# Patient Record
Sex: Female | Born: 1985 | Race: White | Hispanic: No | Marital: Single | Smoking: Current every day smoker
Health system: Southern US, Community
[De-identification: ages and names within clinical notes are randomized; demographics above are authoritative.]

## PROBLEM LIST (undated history)

## (undated) DIAGNOSIS — F429 Obsessive-compulsive disorder, unspecified: Secondary | ICD-10-CM

## (undated) DIAGNOSIS — F32A Depression, unspecified: Secondary | ICD-10-CM

## (undated) DIAGNOSIS — F329 Major depressive disorder, single episode, unspecified: Secondary | ICD-10-CM

## (undated) DIAGNOSIS — F319 Bipolar disorder, unspecified: Secondary | ICD-10-CM

## (undated) HISTORY — PX: OTHER SURGICAL HISTORY: SHX169

## (undated) HISTORY — PX: TUBAL LIGATION: SHX77

---

## 2005-07-18 ENCOUNTER — Ambulatory Visit: Payer: Self-pay | Admitting: Internal Medicine

## 2005-07-18 ENCOUNTER — Inpatient Hospital Stay (HOSPITAL_COMMUNITY): Admission: EM | Admit: 2005-07-18 | Discharge: 2005-07-20 | Payer: Self-pay | Admitting: *Deleted

## 2005-07-22 ENCOUNTER — Emergency Department (HOSPITAL_COMMUNITY): Admission: EM | Admit: 2005-07-22 | Discharge: 2005-07-22 | Payer: Self-pay | Admitting: Emergency Medicine

## 2006-04-19 ENCOUNTER — Ambulatory Visit (HOSPITAL_COMMUNITY): Admission: RE | Admit: 2006-04-19 | Discharge: 2006-04-19 | Payer: Self-pay | Admitting: Obstetrics and Gynecology

## 2006-04-19 ENCOUNTER — Other Ambulatory Visit: Admission: RE | Admit: 2006-04-19 | Discharge: 2006-04-19 | Payer: Self-pay | Admitting: Obstetrics and Gynecology

## 2006-04-28 ENCOUNTER — Ambulatory Visit (HOSPITAL_COMMUNITY): Admission: RE | Admit: 2006-04-28 | Discharge: 2006-04-28 | Payer: Self-pay | Admitting: Obstetrics and Gynecology

## 2006-04-28 ENCOUNTER — Encounter (INDEPENDENT_AMBULATORY_CARE_PROVIDER_SITE_OTHER): Payer: Self-pay | Admitting: Specialist

## 2006-06-23 ENCOUNTER — Emergency Department (HOSPITAL_COMMUNITY): Admission: EM | Admit: 2006-06-23 | Discharge: 2006-06-23 | Payer: Self-pay | Admitting: Family Medicine

## 2006-08-12 ENCOUNTER — Emergency Department (HOSPITAL_COMMUNITY): Admission: EM | Admit: 2006-08-12 | Discharge: 2006-08-12 | Payer: Self-pay | Admitting: Emergency Medicine

## 2007-03-15 ENCOUNTER — Inpatient Hospital Stay (HOSPITAL_COMMUNITY): Admission: AD | Admit: 2007-03-15 | Discharge: 2007-03-15 | Payer: Self-pay | Admitting: Obstetrics & Gynecology

## 2007-09-25 ENCOUNTER — Emergency Department (HOSPITAL_COMMUNITY): Admission: EM | Admit: 2007-09-25 | Discharge: 2007-09-25 | Payer: Self-pay | Admitting: Emergency Medicine

## 2007-11-23 ENCOUNTER — Ambulatory Visit (HOSPITAL_COMMUNITY): Admission: RE | Admit: 2007-11-23 | Discharge: 2007-11-23 | Payer: Self-pay | Admitting: Family Medicine

## 2007-12-26 ENCOUNTER — Encounter: Admission: RE | Admit: 2007-12-26 | Discharge: 2007-12-26 | Payer: Self-pay | Admitting: Family Medicine

## 2008-02-28 ENCOUNTER — Emergency Department (HOSPITAL_COMMUNITY): Admission: EM | Admit: 2008-02-28 | Discharge: 2008-02-28 | Payer: Self-pay | Admitting: Emergency Medicine

## 2008-05-28 ENCOUNTER — Emergency Department (HOSPITAL_COMMUNITY): Admission: EM | Admit: 2008-05-28 | Discharge: 2008-05-28 | Payer: Self-pay | Admitting: Family Medicine

## 2008-05-31 ENCOUNTER — Emergency Department (HOSPITAL_COMMUNITY): Admission: EM | Admit: 2008-05-31 | Discharge: 2008-05-31 | Payer: Self-pay | Admitting: Emergency Medicine

## 2009-01-15 ENCOUNTER — Emergency Department (HOSPITAL_COMMUNITY): Admission: EM | Admit: 2009-01-15 | Discharge: 2009-01-15 | Payer: Self-pay | Admitting: Emergency Medicine

## 2009-02-03 ENCOUNTER — Inpatient Hospital Stay (HOSPITAL_COMMUNITY): Admission: AD | Admit: 2009-02-03 | Discharge: 2009-02-03 | Payer: Self-pay | Admitting: Obstetrics & Gynecology

## 2009-04-25 IMAGING — US US SOFT TISSUE HEAD/NECK
1 series · 14 of 25 positions shown · non-contrast
Comparison: Nuclear medicine thyroid scan of 11/23/2007

CLINICAL DATA: Enlarged thyroid on physical exam

THYROID ULTRASOUND
TECHNIQUE: Ultrasound examination of the thyroid gland and
adjacent soft tissues was performed.

[Series 1: us soft tissue head/neck · 0.06mm/px · 14 of 35 slices shown]
[im 1/35]
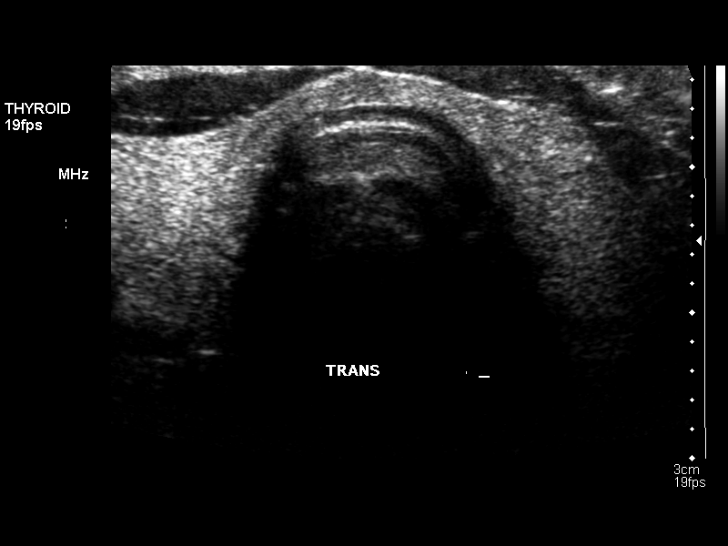
[im 3/35]
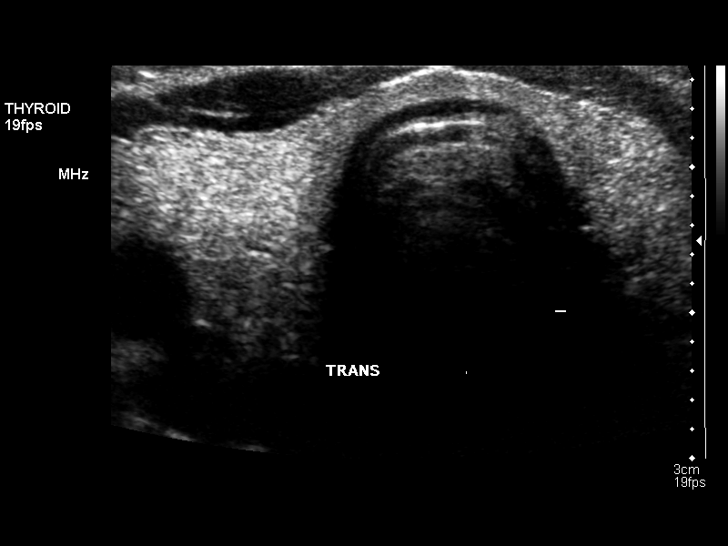
[im 6/35]
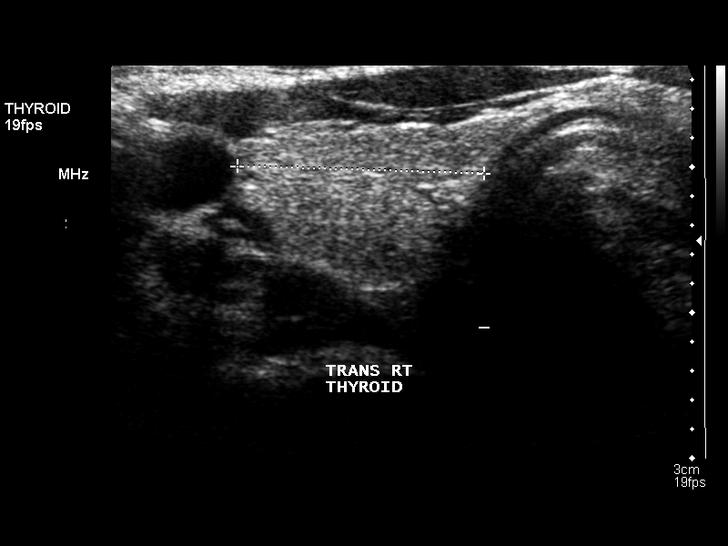
[im 9/35]
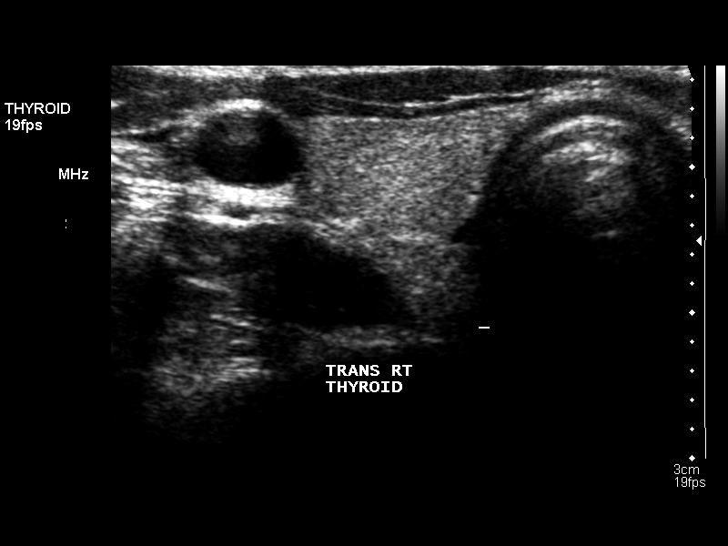
[im 12/35]
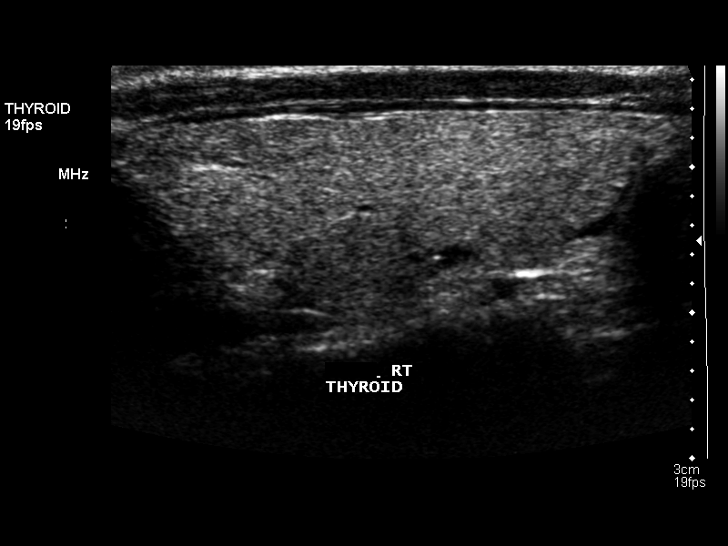
[im 13/35]
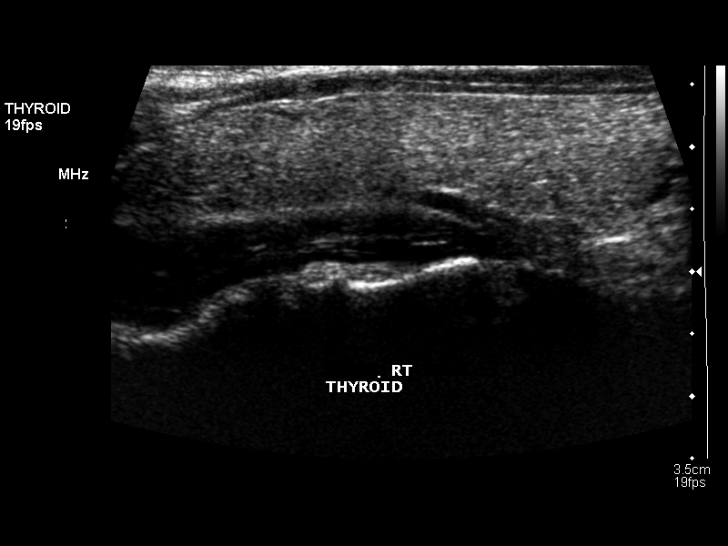
[im 16/35]
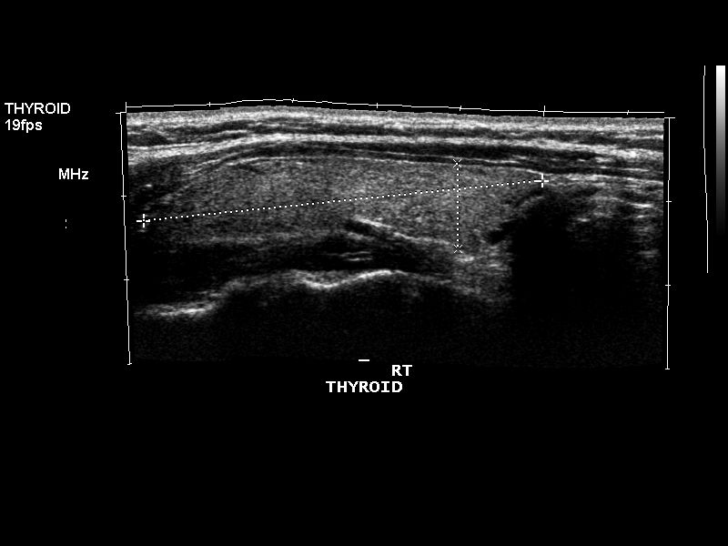
[im 19/35]
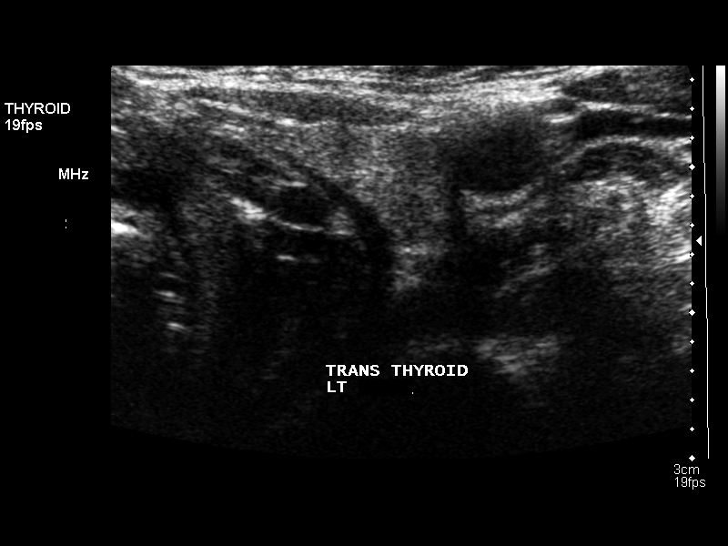
[im 22/35]
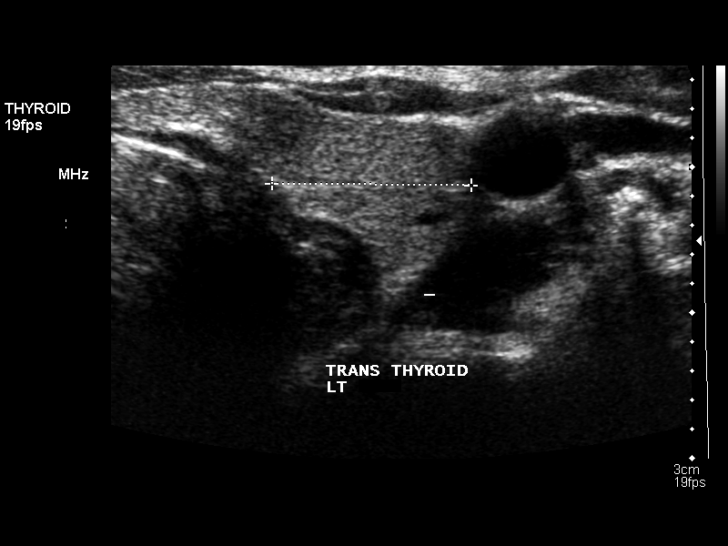
[im 23/35]
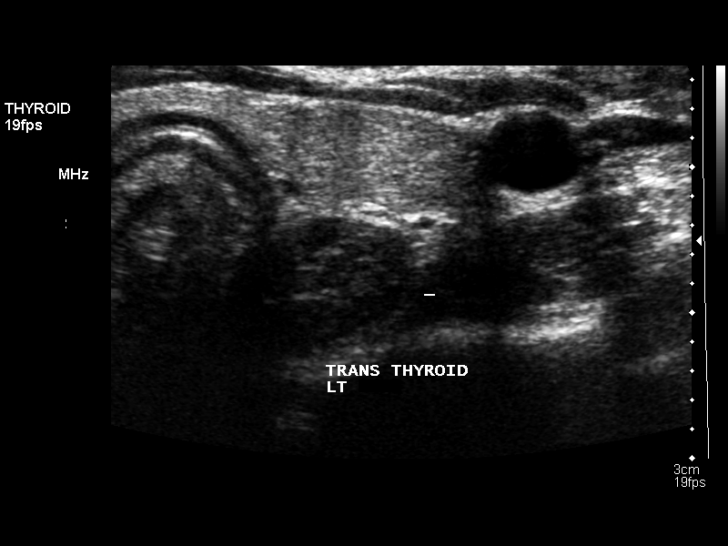
[im 26/35]
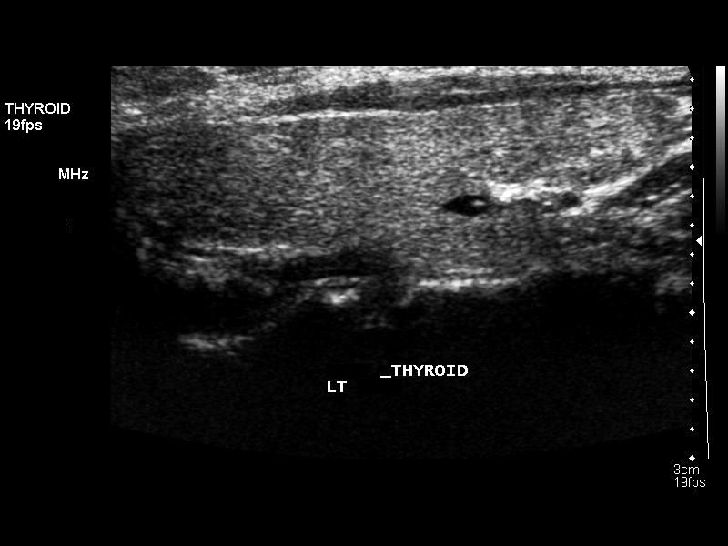
[im 29/35]
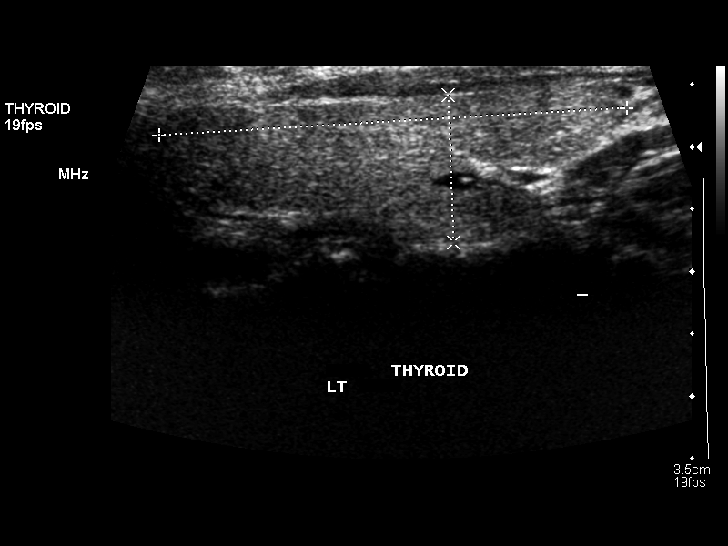
[im 32/35]
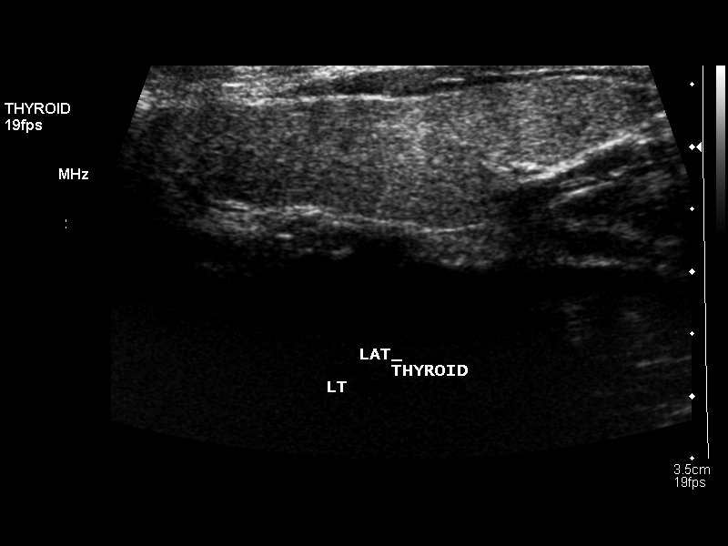
[im 35/35]
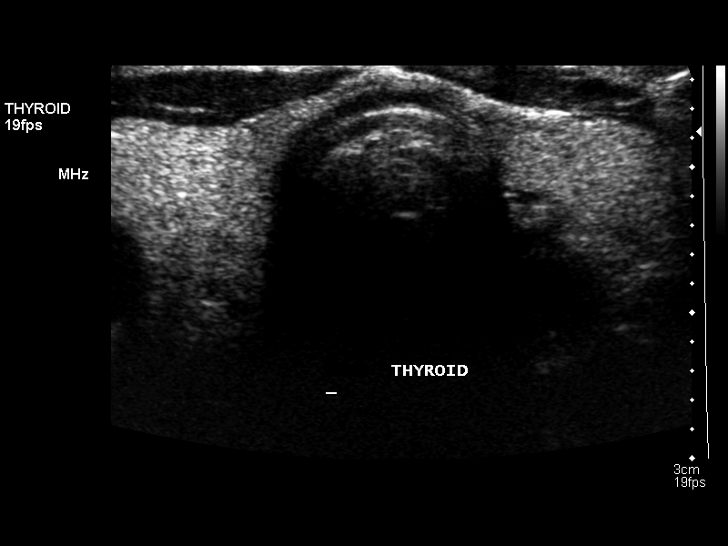

[14 of 25 positions shown; findings below may reference images not displayed]

FINDINGS: The thyroid gland is within normal limits in size.  The
right lobe measures 4.8 cm sagittally, with a depth of 1.0 cm and
width of 1.7 cm.  The left lobe measures 3.8 x 1.2 x 1.4 cm, with
the isthmus measuring 2.4 mm.  The gland is homogeneous.  Only a
tiny hypoechoic nodule of less than 4 mm is noted on the left.
IMPRESSION: The thyroid gland is normal in size with only a tiny 4 mm
hypoechoic nodule on the left.

## 2009-05-06 ENCOUNTER — Ambulatory Visit (HOSPITAL_COMMUNITY): Admission: RE | Admit: 2009-05-06 | Discharge: 2009-05-06 | Payer: Self-pay | Admitting: Obstetrics

## 2009-08-21 ENCOUNTER — Ambulatory Visit (HOSPITAL_COMMUNITY): Admission: RE | Admit: 2009-08-21 | Discharge: 2009-08-21 | Payer: Self-pay | Admitting: Obstetrics

## 2009-10-07 ENCOUNTER — Inpatient Hospital Stay (HOSPITAL_COMMUNITY): Admission: AD | Admit: 2009-10-07 | Discharge: 2009-10-07 | Payer: Self-pay | Admitting: Obstetrics

## 2009-10-09 ENCOUNTER — Inpatient Hospital Stay (HOSPITAL_COMMUNITY): Admission: RE | Admit: 2009-10-09 | Discharge: 2009-10-11 | Payer: Self-pay | Admitting: Obstetrics

## 2009-12-18 ENCOUNTER — Ambulatory Visit (HOSPITAL_COMMUNITY)
Admission: RE | Admit: 2009-12-18 | Discharge: 2009-12-18 | Payer: Self-pay | Source: Home / Self Care | Attending: Obstetrics & Gynecology | Admitting: Obstetrics & Gynecology

## 2010-02-21 ENCOUNTER — Inpatient Hospital Stay (HOSPITAL_COMMUNITY)
Admission: AD | Admit: 2010-02-21 | Discharge: 2010-02-21 | Disposition: A | Discharge status: Home / Self Care | Payer: Medicaid Other | Source: Ambulatory Visit | Attending: Obstetrics | Admitting: Obstetrics

## 2010-02-21 LAB — CBC
MCH: 29.1 pg (ref 26.0–34.0)
Platelets: 281 10*3/uL (ref 150–400)
RBC: 4.23 MIL/uL (ref 3.87–5.11)
RDW: 13 % (ref 11.5–15.5)

## 2010-02-21 LAB — WET PREP, GENITAL: Trich, Wet Prep: NONE SEEN

## 2010-02-21 LAB — POCT PREGNANCY, URINE: Preg Test, Ur: NEGATIVE

## 2010-02-23 LAB — GC/CHLAMYDIA PROBE AMP, GENITAL
Chlamydia, DNA Probe: NEGATIVE
GC Probe Amp, Genital: NEGATIVE

## 2010-03-23 LAB — CBC
HCT: 37.5 % (ref 36.0–46.0)
Hemoglobin: 12.6 g/dL (ref 12.0–15.0)
RDW: 14.5 % (ref 11.5–15.5)
WBC: 8.7 10*3/uL (ref 4.0–10.5)

## 2010-03-25 LAB — CBC
HCT: 29.6 % — ABNORMAL LOW (ref 36.0–46.0)
HCT: 32.1 % — ABNORMAL LOW (ref 36.0–46.0)
Hemoglobin: 11.2 g/dL — ABNORMAL LOW (ref 12.0–15.0)
Hemoglobin: 11.4 g/dL — ABNORMAL LOW (ref 12.0–15.0)
Hemoglobin: 9.9 g/dL — ABNORMAL LOW (ref 12.0–15.0)
MCH: 31 pg (ref 26.0–34.0)
MCHC: 33.2 g/dL (ref 30.0–36.0)
MCHC: 37.9 g/dL — ABNORMAL HIGH (ref 30.0–36.0)
MCV: 93.3 fL (ref 78.0–100.0)
MCV: 97.3 fL (ref 78.0–100.0)
Platelets: 217 10*3/uL (ref 150–400)
RBC: 2.96 MIL/uL — ABNORMAL LOW (ref 3.87–5.11)
RBC: 3.18 MIL/uL — ABNORMAL LOW (ref 3.87–5.11)
RBC: 3.3 MIL/uL — ABNORMAL LOW (ref 3.87–5.11)
RDW: 14.6 % (ref 11.5–15.5)
WBC: 10 10*3/uL (ref 4.0–10.5)
WBC: 10.6 10*3/uL — ABNORMAL HIGH (ref 4.0–10.5)

## 2010-03-25 LAB — CCBB MATERNAL DONOR DRAW

## 2010-03-28 LAB — URINALYSIS, ROUTINE W REFLEX MICROSCOPIC
Glucose, UA: NEGATIVE mg/dL
Hgb urine dipstick: NEGATIVE
Specific Gravity, Urine: 1.015 (ref 1.005–1.030)

## 2010-03-28 LAB — CBC
HCT: 37.7 % (ref 36.0–46.0)
MCHC: 33.6 g/dL (ref 30.0–36.0)
MCV: 94.7 fL (ref 78.0–100.0)
Platelets: 268 10*3/uL (ref 150–400)
RBC: 3.98 MIL/uL (ref 3.87–5.11)

## 2010-03-28 LAB — GC/CHLAMYDIA PROBE AMP, GENITAL: GC Probe Amp, Genital: NEGATIVE

## 2010-03-28 LAB — POCT PREGNANCY, URINE: Preg Test, Ur: POSITIVE

## 2010-03-28 LAB — WET PREP, GENITAL
Trich, Wet Prep: NONE SEEN
Yeast Wet Prep HPF POC: NONE SEEN

## 2010-03-28 LAB — HCG, QUANTITATIVE, PREGNANCY: hCG, Beta Chain, Quant, S: 8382 m[IU]/mL — ABNORMAL HIGH (ref <5)

## 2010-04-20 LAB — STREP A DNA PROBE: Group A Strep Probe: NEGATIVE

## 2010-05-28 NOTE — Discharge Summary (Signed)
Sonya Roach, SANGUINETTI NO.:  [id number]   MEDICAL RECORD NO.:  [id number]          PATIENT TYPE:  INP   LOCATION:  5010                         FACILITY:  [facility]   PHYSICIAN:  Sylvan Cheese, M.D.       DATE OF BIRTH:  [birth year]   DATE OF ADMISSION:  07/18/2005  DATE OF DISCHARGE:  07/20/2005                                 DISCHARGE SUMMARY   DISCHARGE DIAGNOSIS:  Pelvic inflammatory disease.   CONSULTS:  None.   PROCEDURES:  A CT of the abdomen and pelvis was obtained July 18, 2005,  showing heterogenous uterus and could not rule out pelvic inflammatory  disease.   DISCHARGE MEDICATIONS:  1.  Doxycycline 100 mg p.o. b.i.d. for 14 days.  2.  Flagyl 500 mg p.o. b.i.d. for 14 days.  3.  Percocet 5/325 mg one to two tablets by mouth every six hours as needed      for pain.  The patient received 25 of these pills.   FOLLOWUP:  The patient is to follow up with Dr. Sylvan Cheese at the [facility] on July 27th at 3 p.m.  She is provided with the  phone number for further information.   HOSPITAL COURSE:  Mrs. Gary is a 25 year old white female who is  otherwise healthy.  She presented with a two day history of vague abdominal  pain which became severe bilateral lower quadrant pain the night of  admission.  She also complained of nausea, vomiting and copious vaginal  discharge.  1.  Abdominal pain.  A pelvic exam was performed prior to admission at the      [facility] emergency room.  The pelvic exam was negative for cervical      motion tenderness but there was copious malodorous vaginal discharge.      Chlamydia, RPR, HIV, wet prep and urinalysis were all negative.  The      patient did test positive for gonorrhea.  A pelvic and abdominal CT scan      was ordered and could not rule out pelvic inflammatory disease, while      the appendix appeared normal.  The patient was started on cefoxitin 2      grams IV every six hours as well as  Doxycycline 100 mg IV twice daily.      The Doxycycline was changed to oral route the morning of July 10th.  She      received Zofran __________ and Dilaudid p.r.n. pain.  By the morning of      discharge the patient's abdominal pain was minimal and she had no nausea      or vomiting.  She was tolerating p.o. intake.  She received ceftriaxone      250 mg IV times one dose prior to discharge.  She will go home with      prescriptions for Flagyl 500 mg p.o. b.i.d. for 14 days as well as      Doxycycline 100 mg p.o. b.i.d. for 14 days.  2.  Leukocytosis.  The patient was admitted with a white count of 28.9.  White count was 10.1 the morning of admission.  This is likely secondary      to pelvic inflammatory disease (See #1 above).           ______________________________  Sylvan Cheese, M.D.     MJ/MEDQ  D:  07/20/2005  T:  07/21/2005  Job:  [id number]

## 2010-05-28 NOTE — Op Note (Signed)
Sonya Roach, SCHUELKE           ACCOUNT NO.:  [id number]   MEDICAL RECORD NO.:  [id number]          PATIENT TYPE:  AMB   LOCATION:  SDC                           FACILITY:  [facility]   PHYSICIAN:  James A. Ashley Royalty, M.D.DATE OF BIRTH:  1985-07-27   DATE OF PROCEDURE:  04/28/2006  DATE OF DISCHARGE:                               OPERATIVE REPORT   PREOPERATIVE DIAGNOSIS:  Inevitable abortion at approximately 8 weeks'  gestation.   POSTOPERATIVE DIAGNOSIS:  Inevitable abortion at approximately 8 weeks'  gestation.  Pathology pending.   PROCEDURE:  Suction dilatation and curettage.   SURGEON:  Rudy Jew. Ashley Royalty, M.D.   ANESTHESIA:  Monitored anesthesia care with 1% Xylocaine paracervical  block (20 mL).   ESTIMATED BLOOD LOSS:  Less than 25 mL.   COMPLICATIONS:  None.   PACKS AND DRAINS:  None.   PROCEDURE:  The patient was taken to the operating room and placed in  the dorsal supine position.  After IV analgesics were administered, she  was placed in the lithotomy position and prepped and draped in the usual  manner for vaginal surgery.  Posterior weighted retractor was placed per  vagina.  The anterior lip of the cervix was grasped with a single-tooth  tenaculum.  Approximately 20 mL of 1% Xylocaine was instilled  circumferentially around the cervix to create a paracervical block.  The  uterus was then gently sounded to 8 cm.  The cervix was then serially  dilated to a size 25-French using Pratt dilators.  An 8-mm suction  curette was introduced into the uterine cavity and suction applied.  A  moderate amount of apparent products of conception was delivered through  the tubing.  After several passes with the suction curette, no  additional tissue was obtained.  At this point, the patient was felt to  have benefited maximally from the surgical procedure.  The vaginal  instruments were removed, hemostasis noted and the procedure terminated.   The patient was taken to the  recovery room in excellent condition.      James A. Ashley Royalty, M.D.  Electronically Signed     JAM/MEDQ  D:  04/28/2006  T:  04/28/2006  Job:  [id number]

## 2010-05-28 NOTE — H&P (Signed)
NAMEEMIAH, PELLICANO           ACCOUNT NO.:  [id number]   MEDICAL RECORD NO.:  [id number]          PATIENT TYPE:  INP   LOCATION:  5010                         FACILITY:  [facility]   PHYSICIAN:  Devra Dopp, MD     DATE OF BIRTH:  [birth year]   DATE OF ADMISSION:  07/18/2005  DATE OF DISCHARGE:                                HISTORY & PHYSICAL   PRIMARY CARE PHYSICIAN:  None.   CHIEF COMPLAINT:  Abdominal pain.   HISTORY OF PRESENT ILLNESS:  The patient is a 25 year old white female with  a two-day history of vague abdominal pain who awoke in the night with severe  bilateral lower quadrant pain, right greater than left, with some nausea,  vomiting, diarrhea, nonbloody, nonbilious.  The patient has had recent bouts  of vaginal discharge and dark urine.   REVIEW OF SYSTEMS:  She denies any recent illness.  No pain.  Denies any  recent illnesses.  Does complain of pain in her lungs with deep inspiration.   PAST MEDICAL HISTORY:  She is a gravida 1, para 1 with history of a vaginal  delivery three years ago.  She has regular menses since she receives Depo  injections once in October 2006.   SEXUAL HISTORY:  She has no new partners.  Her last intercourse was one day  ago.  She is in a monogamous relationship.   ALLERGIES:  None.   MEDICATIONS:  None.   FAMILY HISTORY:  Grandfather with hypertension, hypercholesterolemia.  No  family history of GI issues.   SOCIAL HISTORY:  She smokes half a pack per day of cigarettes.  Uses  marijuana daily.  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  She does not drink alcohol.  The father of her  daughter whom she is not with is to care for her when she is away for the  [employer].   PHYSICAL EXAMINATION:  VITAL SIGNS:  Temperature 101.5, pulse 65 to 110,  blood pressure 100-116/64-78.  Respirations 18 to 24.  Oxygen saturation 94  to 100% on room air.  GENERAL:  She is a well-nourished,  well-developed young female in no acute  distress.  Alert and oriented x3.  Appropriate mood and affect.  HEENT:  Normocephalic, atraumatic.  Pupils are equal, round and reactive to  light and accommodation.  No scleral icterus.  Oral mucosa pink and moist.  NECK:  Supple.  No lymphadenopathy.  CARDIOVASCULAR:  S1 and S2.  Regular rate and rhythm.  LUNGS:  Clear to auscultation.  No wheezes, rales or rhonchi with good  respiratory effort.  ABDOMEN:  Positive bowel sounds.  Tenderness to palpation in the lower  quadrants bilaterally with some guarding.  No rebound.  Some mild right  upper quadrant pain.  EXTREMITIES:  With no edema.  GU:  Per the EDPA.  Copious amounts of vaginal discharge, foul smelling,  with no cervical motion tenderness.  The vaginal mucosa was normal. GC and  Chlamydia, RPR and wet prep were done at that time.   LABORATORY DATA:  Urinalysis was cloudy with  ketones, 30 protein, large  leukocyte esterase.  Urine pregnancy negative.  Urine micro showed many  epithelial cells, too-numerous-to-count white cells, many bacteria, and some  clue cells.  Wet prep showed too-numerous-to-count white cells, no clue  cells. Otherwise normal.  RPR nonreactive.  White count at 28.9, hemoglobin  13.8, hematocrit 40.3, platelets 271.  Sodium 136, potassium 3.8, chloride  107, CO2 23, BUN 10, creatinine 0.7, glucose 99, calcium 8.4, total protein  6, lipase 24, albumin 3.5, AST 24, ALT 14, alk phos 38, total bilirubin 1.1.  Abdominal and pelvic CT scan showed normal appendix, probable adynamic  ileus.  Cannot rule out small bowel obstruction, question of PID, plus a  heterogeneous uterus.   ASSESSMENT/PLAN:  A 25 year old white female with abdominal pain, nausea and  vomiting.  Problem #1.  Abdominal pain might be secondary to pelvic inflammatory  disease.  Labs are pending.  Question of Fitz-Hugh-Curtis syndrome given  right upper quadrant pain. Will await cultures.  Cefoxitin and  doxycycline  IV for now.  Tylenol and Dilaudid for pain.  Problem #2.  Nausea and vomiting.  Improved during the exam.  Will try a  bland diet and Zofran as needed.  Problem #3. Pelvic inflammatory disease.  Likely the cause of #1.  Will also  get HIV testing per patient request.  Problem #4.  Tobacco.  The patient declines nicotine patch for now.  Will  start as needed and encourage smoking cessation.      Devra Dopp, MD     TH/MEDQ  D:  07/19/2005  T:  07/19/2005  Job:  [id number]

## 2010-10-04 LAB — WET PREP, GENITAL
Trich, Wet Prep: NONE SEEN
Yeast Wet Prep HPF POC: NONE SEEN

## 2010-10-04 LAB — GC/CHLAMYDIA PROBE AMP, GENITAL: Chlamydia, DNA Probe: NEGATIVE

## 2010-10-04 LAB — CBC
HCT: 42
Platelets: 250
RDW: 13.3
WBC: 10.1

## 2011-05-19 ENCOUNTER — Ambulatory Visit: Payer: Self-pay

## 2011-05-19 ENCOUNTER — Emergency Department: Payer: Self-pay | Admitting: *Deleted

## 2011-05-30 ENCOUNTER — Inpatient Hospital Stay (HOSPITAL_COMMUNITY)
Admission: AD | Admit: 2011-05-30 | Discharge: 2011-06-03 | DRG: 885 | Disposition: A | Discharge status: Home / Self Care | Payer: Medicaid Other | Source: Ambulatory Visit | Attending: Psychiatry | Admitting: Psychiatry

## 2011-05-30 ENCOUNTER — Encounter (HOSPITAL_COMMUNITY): Payer: Self-pay | Admitting: Emergency Medicine

## 2011-05-30 ENCOUNTER — Emergency Department (HOSPITAL_COMMUNITY)
Admission: EM | Admit: 2011-05-30 | Discharge: 2011-05-30 | Disposition: A | Discharge status: Other Acute Inpatient Hospital | Payer: Medicaid Other | Source: Home / Self Care | Attending: Emergency Medicine | Admitting: Emergency Medicine

## 2011-05-30 DIAGNOSIS — F17200 Nicotine dependence, unspecified, uncomplicated: Secondary | ICD-10-CM | POA: Diagnosis present

## 2011-05-30 DIAGNOSIS — F329 Major depressive disorder, single episode, unspecified: Secondary | ICD-10-CM

## 2011-05-30 DIAGNOSIS — F1210 Cannabis abuse, uncomplicated: Secondary | ICD-10-CM | POA: Diagnosis present

## 2011-05-30 DIAGNOSIS — Z79899 Other long term (current) drug therapy: Secondary | ICD-10-CM

## 2011-05-30 DIAGNOSIS — F3130 Bipolar disorder, current episode depressed, mild or moderate severity, unspecified: Secondary | ICD-10-CM | POA: Insufficient documentation

## 2011-05-30 DIAGNOSIS — IMO0002 Reserved for concepts with insufficient information to code with codable children: Secondary | ICD-10-CM

## 2011-05-30 DIAGNOSIS — F429 Obsessive-compulsive disorder, unspecified: Secondary | ICD-10-CM | POA: Insufficient documentation

## 2011-05-30 DIAGNOSIS — F39 Unspecified mood [affective] disorder: Principal | ICD-10-CM | POA: Diagnosis present

## 2011-05-30 DIAGNOSIS — F319 Bipolar disorder, unspecified: Secondary | ICD-10-CM

## 2011-05-30 HISTORY — DX: Obsessive-compulsive disorder, unspecified: F42.9

## 2011-05-30 HISTORY — DX: Bipolar disorder, unspecified: F31.9

## 2011-05-30 LAB — URINALYSIS, ROUTINE W REFLEX MICROSCOPIC
Glucose, UA: NEGATIVE mg/dL
Ketones, ur: NEGATIVE mg/dL
Nitrite: NEGATIVE
Protein, ur: NEGATIVE mg/dL
Urobilinogen, UA: 0.2 mg/dL (ref 0.0–1.0)

## 2011-05-30 LAB — ETHANOL: Alcohol, Ethyl (B): <11 mg/dL (ref 0–11)

## 2011-05-30 LAB — URINE MICROSCOPIC-ADD ON

## 2011-05-30 LAB — RAPID URINE DRUG SCREEN, HOSP PERFORMED
Amphetamines: NOT DETECTED
Barbiturates: NOT DETECTED
Benzodiazepines: NOT DETECTED
Tetrahydrocannabinol: POSITIVE — AB

## 2011-05-30 LAB — PREGNANCY, URINE: Preg Test, Ur: NEGATIVE

## 2011-05-30 LAB — BASIC METABOLIC PANEL
CO2: 23 mEq/L (ref 19–32)
Calcium: 9.7 mg/dL (ref 8.4–10.5)
Creatinine, Ser: 0.58 mg/dL (ref 0.50–1.10)
GFR calc Af Amer: >90 mL/min (ref >90)
GFR calc non Af Amer: >90 mL/min (ref >90)
Sodium: 136 mEq/L (ref 135–145)

## 2011-05-30 MED ORDER — LORAZEPAM 1 MG PO TABS: 1.0000 mg | ORAL_TABLET | Freq: Three times a day (TID) | ORAL | Status: DC | PRN

## 2011-05-30 MED ORDER — LORAZEPAM 1 MG PO TABS: 1.0000 mg | ORAL_TABLET | Freq: Four times a day (QID) | ORAL | Status: DC | PRN

## 2011-05-30 MED ORDER — RISPERIDONE 1 MG PO TABS
1.0000 mg | ORAL_TABLET | Freq: Every day | ORAL | Status: DC
  Administered 2011-05-30: 1 mg via ORAL
  Filled 2011-05-30 (×4): qty 1

## 2011-05-30 MED ORDER — LORAZEPAM 1 MG PO TABS: 1.0000 mg | ORAL_TABLET | Freq: Once | ORAL | Status: DC

## 2011-05-30 MED ORDER — ONDANSETRON HCL 8 MG PO TABS: 4.0000 mg | ORAL_TABLET | Freq: Three times a day (TID) | ORAL | Status: DC | PRN

## 2011-05-30 MED ORDER — ONDANSETRON HCL 4 MG PO TABS: 4.0000 mg | ORAL_TABLET | Freq: Three times a day (TID) | ORAL | Status: DC | PRN

## 2011-05-30 MED ORDER — ACETAMINOPHEN 325 MG PO TABS: 650.0000 mg | ORAL_TABLET | ORAL | Status: DC | PRN

## 2011-05-30 MED ORDER — ALUM & MAG HYDROXIDE-SIMETH 200-200-20 MG/5ML PO SUSP: 30.0000 mL | ORAL | Status: DC | PRN

## 2011-05-30 MED ORDER — CLONAZEPAM 1 MG PO TABS
1.0000 mg | ORAL_TABLET | Freq: Every day | ORAL | Status: DC
  Administered 2011-05-30: 1 mg via ORAL
  Filled 2011-05-30: qty 1

## 2011-05-30 MED ORDER — LORAZEPAM 1 MG PO TABS
1.0000 mg | ORAL_TABLET | Freq: Once | ORAL | Status: AC
  Administered 2011-05-30: 1 mg via ORAL
  Filled 2011-05-30: qty 1

## 2011-05-30 MED ORDER — LORAZEPAM 1 MG PO TABS
1.0000 mg | ORAL_TABLET | Freq: Three times a day (TID) | ORAL | Status: DC | PRN
  Administered 2011-05-30: 1 mg via ORAL
  Filled 2011-05-30: qty 1

## 2011-05-30 MED ORDER — MAGNESIUM HYDROXIDE 400 MG/5ML PO SUSP: 30.0000 mL | Freq: Every day | ORAL | Status: DC | PRN

## 2011-05-30 MED ORDER — ACETAMINOPHEN 325 MG PO TABS: 650.0000 mg | ORAL_TABLET | Freq: Four times a day (QID) | ORAL | Status: DC | PRN

## 2011-05-30 MED ORDER — RISPERIDONE 1 MG PO TABS: 1.0000 mg | ORAL_TABLET | Freq: Four times a day (QID) | ORAL | Status: DC | PRN

## 2011-05-30 NOTE — ED Notes (Signed)
Valuables in locker 2.

## 2011-05-30 NOTE — ED Notes (Signed)
Patient transferred to [facility] via security.  Patient belongings returned at this time.
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
## 2011-05-30 NOTE — BH Assessment (Signed)
Assessment Note  Update:  Received a call from [facility] stating pt was accepted by NP Lynann Bologna to Dr. Catha Brow to bed 301-2 and that pt could be transported.  Updated assessment disposition, completed assessment notification and completed support paperwork and faxed to [facility].  Faxed to [facility] to log.  EDP king notified as well as ED staff.  ED staff to arrange transport via security, as pt is voluntary.     Disposition:  Disposition Disposition of Patient: Inpatient treatment program Type of inpatient treatment program: Adult Patient referred to: Other (Comment) (Pt accepted [facility])  On Site Evaluation by:   Reviewed with Physician:  Margette Fast, Rennis Harding 05/30/2011 6:34 PM

## 2011-05-30 NOTE — ED Notes (Signed)
Sitter went to lunch 

## 2011-05-30 NOTE — ED Provider Notes (Signed)
Medical screening examination/treatment/procedure(s) were performed by non-physician practitioner and as supervising physician I was immediately available for consultation/collaboration.   Dione Booze, MD 05/30/11 [phone]

## 2011-05-30 NOTE — ED Provider Notes (Addendum)
History     CSN: [id number]  Arrival date & time 05/30/11  0865   First MD Initiated Contact with Patient 05/30/11 [phone]      Chief Complaint  Patient presents with  . Medical Clearance    sucidal ideation with vague plan    (Consider location/radiation/quality/duration/timing/severity/associated sxs/prior treatment) HPI Comments: Sonya Roach presents for evaluation and treatment of her bipolar disorder.  She states she has been increasingly manic over the past, stating increased nervousness, anxiety.  She's also been very tearful and depressed over the fact that the children are in the care of CPS.  She denies homicidal ideation and suicidal ideation, but states she needs help and is afraid she may come to harm if she is not treated.  She reports she has been unable to sleep, eat and has been seen at the homeless shelter the past several evenings.  She reports she just arrived from [place] this morning stating she has been "walking around the countryside" stating she walked to [facility] [place]  And back here to [place].  She has been followed at [facility] for her bipolar disorder, and has been on clonazepam and Prozac but has not taken these medicines for the past 3 weeks.  The history is provided by the patient.    Past Medical History  Diagnosis Date  . OCD (obsessive compulsive disorder)   . Bipolar 1 disorder     History reviewed. No pertinent past surgical history.  History reviewed. No pertinent family history.  History  Substance Use Topics  . Smoking status: Current Everyday Smoker -- 1.0 packs/day  . Smokeless tobacco: Not on file  . Alcohol Use: No    OB History    Grav Para Term Preterm Abortions TAB SAB Ect Mult Living                  Review of Systems  Constitutional: Negative for fever.  HENT: Negative for congestion, sore throat and neck pain.   Eyes: Negative.   Respiratory: Negative for chest tightness and shortness of  breath.   Cardiovascular: Negative for chest pain.  Gastrointestinal: Negative for nausea and abdominal pain.  Genitourinary: Negative.   Musculoskeletal: Negative for joint swelling and arthralgias.  Skin: Negative.  Negative for rash and wound.  Neurological: Negative for dizziness, weakness, light-headedness, numbness and headaches.  Hematological: Negative.   Psychiatric/Behavioral: Positive for sleep disturbance and decreased concentration. The patient is nervous/anxious.     Allergies  Review of patient's allergies indicates no known allergies.  Home Medications  No current outpatient prescriptions on file.  BP 131/91  Pulse 93  Temp(Src) 98.4 F (36.9 C) (Oral)  Resp 20  SpO2 98%  LMP 05/09/2011  Physical Exam  Nursing note and vitals reviewed. Constitutional: She appears well-developed and well-nourished.  HENT:  Head: Normocephalic and atraumatic.  Eyes: Conjunctivae are normal.  Neck: Normal range of motion.  Cardiovascular: Normal rate, regular rhythm, normal heart sounds and intact distal pulses.   Pulmonary/Chest: Effort normal and breath sounds normal. She has no wheezes.  Abdominal: Soft. Bowel sounds are normal. There is no tenderness.  Musculoskeletal: Normal range of motion.  Neurological: She is alert.  Skin: Skin is warm and dry.  Psychiatric: Her behavior is normal. Thought content normal. Her mood appears anxious. Her affect is blunt. Her speech is not rapid and/or pressured. She exhibits a depressed mood.       Tearful.    ED Course  Procedures (including critical  care time)  Labs Reviewed  URINE RAPID DRUG SCREEN (HOSP PERFORMED) - Abnormal; Notable for the following:    Cocaine POSITIVE (*)    Tetrahydrocannabinol POSITIVE (*)    All other components within normal limits  BASIC METABOLIC PANEL - Abnormal; Notable for the following:    Potassium 3.4 (*)    All other components within normal limits  URINALYSIS, ROUTINE W REFLEX MICROSCOPIC -  Abnormal; Notable for the following:    APPearance CLOUDY (*)    Leukocytes, UA TRACE (*)    All other components within normal limits  URINE MICROSCOPIC-ADD ON - Abnormal; Notable for the following:    Squamous Epithelial / LPF MANY (*)    All other components within normal limits  PREGNANCY, URINE  ETHANOL   No results found.   1. Depression   2. Bipolar 1 disorder       MDM  Spoke with the act team who will valuate this patient for possible placement.  She has been medically screened.  4:56 PM ACT working on placement at [facility].  Dr. Brooke Dare will follow pt.        Burgess Amor, PA 05/30/11 1148  Burgess Amor, [place] 05/30/11 [phone]

## 2011-05-30 NOTE — ED Notes (Signed)
Patient belongings bagged and labeled.  Bags given to Cassie, Charity fundraiser.  Patient changed into blue scrubs and wandded by security in triage prior to being moved to exam room.

## 2011-05-30 NOTE — ED Notes (Signed)
Pt got into verbal argument with other pt in room. Pt moved out of room into private room, sitter at bedside. Pt calm, cooperative at this time. No further needs.

## 2011-05-30 NOTE — ED Notes (Signed)
Pt reports feeling of nervousness and anxiety progressing for last 3 weeks. Unable to sleep or eat , denies issues ofm being harmed or threatened by others , denies AV hallucinations

## 2011-05-30 NOTE — ED Notes (Signed)
CN aware of pt, AC notified.

## 2011-05-30 NOTE — Progress Notes (Signed)
Patient ID: Sonya Roach, female   DOB: 1985/06/03, 26 y.o.   MRN: [id number] Patient admitted voluntarily for complaints of anxiety and SI. She reports being manic for a period of three weeks. Reports taking prescribed risperdal, prozac, klonopin but medications not effective. Denies having a suicidal plan but "is very frustrated with the way I have been feeling." Patient reports having CPS take her kids away due to "extreme mood swings" and children's father kicked her out. Has been walking for miles at a time and has no stable living environment right now. Patient was very tearful during the admission and alluded to suffering "extreme emotional pain." She is interested in being started on a mood stabilizer. Denies using cocaine but her UDS was positive. Oriented to the unit and routine.

## 2011-05-30 NOTE — ED Notes (Signed)
Patient presented to ED with Suicidal thoughts.She gives the history of bipolar and have not been taking her medications.She also says she has OCD.She denies any thought of harming others.  Patient had  Idea of hurting herself but no plans.Pt homeless at this time.

## 2011-05-31 DIAGNOSIS — F39 Unspecified mood [affective] disorder: Secondary | ICD-10-CM | POA: Diagnosis present

## 2011-05-31 DIAGNOSIS — F1210 Cannabis abuse, uncomplicated: Secondary | ICD-10-CM

## 2011-05-31 MED ORDER — DIVALPROEX SODIUM ER 500 MG PO TB24
500.0000 mg | ORAL_TABLET | Freq: Every day | ORAL | Status: DC
  Administered 2011-05-31 – 2011-06-02 (×3): 500 mg via ORAL
  Filled 2011-05-31 (×5): qty 1

## 2011-05-31 MED ORDER — NICOTINE 21 MG/24HR TD PT24
21.0000 mg | MEDICATED_PATCH | Freq: Every day | TRANSDERMAL | Status: DC
  Administered 2011-05-31: 21 mg via TRANSDERMAL
  Filled 2011-05-31 (×3): qty 1

## 2011-05-31 MED ORDER — RISPERIDONE 2 MG PO TABS
2.0000 mg | ORAL_TABLET | Freq: Every day | ORAL | Status: DC
  Administered 2011-05-31 – 2011-06-02 (×3): 2 mg via ORAL
  Filled 2011-05-31 (×3): qty 1
  Filled 2011-05-31: qty 5
  Filled 2011-05-31: qty 1

## 2011-05-31 NOTE — Tx Team (Signed)
Initial Interdisciplinary Treatment Plan  PATIENT STRENGTHS: (choose at least two) Capable of independent living General fund of knowledge Motivation for treatment/growth  PATIENT STRESSORS: Financial difficulties Medication change or noncompliance Substance abuse Traumatic event   PROBLEM LIST: Problem List/Patient Goals Date to be addressed Date deferred Reason deferred Estimated date of resolution  Depression-children were taken by CPS in March 2013, boyfriend kicked her out      Substance abuse      Risk for self harm                                           DISCHARGE CRITERIA:  Ability to meet basic life and health needs Adequate post-discharge living arrangements Improved stabilization in mood, thinking, and/or behavior Motivation to continue treatment in a less acute level of care Safe-care adequate arrangements made Verbal commitment to aftercare and medication compliance Withdrawal symptoms are absent or subacute and managed without 24-hour nursing intervention  PRELIMINARY DISCHARGE PLAN: Attend aftercare/continuing care group Attend 12-step recovery group Outpatient therapy Placement in alternative living arrangements  PATIENT/FAMIILY INVOLVEMENT: This treatment plan has been presented to and reviewed with the patient, Sonya Roach, and/or family member.  The patient and family have been given the opportunity to ask questions and make suggestions.  Jesus [facility] 05/31/2011, 3:01 AM
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
## 2011-05-31 NOTE — Progress Notes (Signed)
[facility] Group Notes:  (Counselor/Nursing/MHT/Case Management/Adjunct)  05/31/2011 10:11 PM  Type of Therapy:  Group Therapy at 1:15  Participation Level:  Active  Participation Quality:  Attentive and Sharing  Affect:  Flat  Cognitive:  Oriented  Insight:  Limited  Engagement in Group:  Good  Engagement in Therapy:  Limited  Modes of Intervention:  Clarification, Socialization and Support  Summary of Progress/Problems:  Sonya Roach was actively involved in group discussion on feelings about diagnosis. She shared "I am not supposed to be on this hall; I do not have substance abuse issues.  I have problems with my mood and focus."  Patient shared how she copes with mood disturbances by walking miles (10 +) daily for last few weeks.  Others offered support.  Sonya Roach later shared "The acceptance from people here is something I am not used to; people seem to mean it when they ask how I am, but that doesn't happen in the 'real' world."   Clide Dales 05/31/2011, 10:16 PM
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
## 2011-05-31 NOTE — Progress Notes (Signed)
Pt has been up for groups today and now has an order to program on 500 hall and stay in her room on 300 hall.  Pt was happy with this change.  She stated,"I feel my substance abuse is not my main problem, but my mental heath is my problem"  She rated her depression a 7 denied feeling hopeless and her anxiety was 10+ on her self-inventory.  She was started on depakote today and her risperdal will be increased to 2 mg tonight at bedtime.  She voiced understanding to her medication changes.  She does admit she is homeless and unsure of her discharge plans.  She denied any S/H ideation or symptoms of withdrawal.

## 2011-05-31 NOTE — ED Provider Notes (Signed)
Medical screening examination/treatment/procedure(s) were performed by non-physician practitioner and as supervising physician I was immediately available for consultation/collaboration.   Dione Booze, MD 05/31/11 [phone]

## 2011-05-31 NOTE — Discharge Planning (Signed)
New patient attended AM group.  Good participation.  Adamant that she is here for mental health issues, specifically her bipolar symptoms.  States she has no outpt providers "because everyone wants to focus on substance abuse issues."  Wonders why she is not on 500 hall.  Admits to marijuana use for self medication.  Interestingly enough, UDS positive for cocaine.

## 2011-06-01 NOTE — Progress Notes (Signed)
Pt was in bed upon first assessment.  She did wake to come to medication window for her am meds.  She stated,"I usually stay real sleepy when I first start on risperdal for at least 5-7 days".  She has been up for a few groups as the day progressed.  She rated her depression a 3 hopelessness and anxiety a 4 on her self-inventory.  She denied any S/H ideation or A/V hallucinations.  She has denied any signs or symptoms of withdrawal.  She had a meeting with her CPS worker this afternoon.   She stated,"he said as long as I stay on my meds and at the shelter, then my daughter will come back to stay with me in two weeks"  She went on to say that he will be picking her up at discharge to take her to [facility] to the shelter.  No med changes or prn meds thus far.

## 2011-06-01 NOTE — Progress Notes (Signed)
[facility] MD Progress Note  06/01/2011 12:00 PM   S/O: Pt seen and evaluated in treatment team.  Reviewed short term and long term goals, medications, current treatment in the hospital and acute/chronic safety.  Pt denied any current thoughts of self harm, suicidal ideation or homicidal ideation.  Contracted for safety on the unit.  No acute issues noted.  VS reviewed with team.  Pt agreeable with treatment plan, see orders. Stated that she was "better today" and was looking forward to possibly going to the shelter and restating classes in the fall.   Sleep:  Number of Hours: 6.75    Vital Signs:Blood pressure 97/65, pulse 89, temperature 97.6 F (36.4 C), temperature source Oral, resp. rate 16, height 4' 11.5" (1.511 m), weight 65.318 kg (144 lb), last menstrual period 05/09/2011.  Lab Results: No results found for this or any previous visit (from the past 48 hour(s)).  Plan: Treatment goals and medication management reviewed with patient.  Continue current treatment plan and medications.  No SEs reported at current dosages.  No acute medical or psychiatric issues noted.  Pt agreeable with plan.  Discussed with team.  Potential discharge Friday.  Kuroski-Mazzei, Makar Slatter 06/01/2011, 12:00 PM

## 2011-06-01 NOTE — Discharge Planning (Signed)
Velta c/o being tired this AM due to start of medication last night.  Mood is good.  Stated that yesterday was a "bad day."  Plans on returning to [place] to homeless shelter and following up with [facility].  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  Also wants to get Sec 8 housing and get daughter back.  Likely d/c Friday.
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
## 2011-06-01 NOTE — Progress Notes (Signed)
[facility] Family/Significant Other Suicide Prevention Education  Suicide Prevention Education:  Contact Attempts: Yaritzi Craun (sister) [phone] has been identified by the patient as the family member/significant other with whom the patient will be residing, and identified as the person(s) who will aid the patient in the event of a mental health crisis.  With written consent from the patient, an attempt was made by writer to provide suicide prevention education, prior to the patient's discharge.  We were unsuccessful in providing suicide prevention education. Another attempt will be made this week by PRN staff.   Date and time of first attempt: 06/01/2011 5:55 PM    Sonya Roach 06/01/2011, 5:55 PM

## 2011-06-01 NOTE — Treatment Plan (Signed)
Interdisciplinary Treatment Plan Update (Adult)  Date: 06/01/2011  Time Reviewed: 8:25 AM   Progress in Treatment: Attending groups: Yes Participating in groups: Yes Taking medication as prescribed: Yes Tolerating medication: Yes   Family/Significant other contact made:  No Patient understands diagnosis:  Yes  As evidenced by asking for help with mood stabilization Discussing patient identified problems/goals with staff:  Yes See below Medical problems stabilized or resolved:  Yes Denies suicidal/homicidal ideation: Yes In tx team Issues/concerns per patient self-inventory:  Not filled out Other:  New problem(s) identified: N/A  Reason for Continuation of Hospitalization: Medication stabilization  Interventions implemented related to continuation of hospitalization: Risperdal,depakote trial  Encourage group attendance and participation Additional comments:  Estimated length of stay:2 days  Discharge Plan:Return home, follow up outpt  New goal(s): N/A  Review of initial/current patient goals per problem list:   1.  Goal(s):Eliminate SI  Met:  Yes  Target date:5/22  As evidenced ZO:XWRU report in tx team  2.  Goal (s):Stabilize mood  Met:  No  Target date:5/24  As evidenced EA:VWUJ report of manageable anxiety, depression  3.  Goal(s):Identify comprehensive mental wellness and sobriety plan  Met:  No  Target date:5/24  As evidenced WJ:XBJY report  4.  Goal(s):  Met:  No  Target date:  As evidenced by:  Attendees: Patient: Sonya Roach  06/01/2011 8:25 AM  Family:     Physician:  Lupe Carney 06/01/2011 8:25 AM   Nursing: Robbie Louis   06/01/2011 8:25 AM   Case Manager:  Richelle Ito, LCSW 06/01/2011 8:25 AM   Counselor:  Ronda Fairly, LCSWA 06/01/2011 8:25 AM   Other:     Other:     Other:     Other:      Scribe for Treatment Team:   Ida Rogue, 06/01/2011 8:25 AM
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
## 2011-06-01 NOTE — Progress Notes (Signed)
PT denies SI/HI/AVH. Pt anxious but cooperative. Pt rates her depression a 6 and her hopelessness a 4. Pt participated in group on [address] tonight. Support and encouragement offered. Pt receptive.
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
## 2011-06-01 NOTE — Progress Notes (Signed)
[facility] Adult Inpatient Family/Significant Other Suicide Prevention Education  Suicide Prevention Education:  Education Completed; Patient's sister, Sonya Roach at [phone] has been identified by the patient as the family member/significant other with whom the patient will be residing, and identified as the person(s) who will aid the patient in the event of a mental health crisis (suicidal ideations/suicide attempt).  With written consent from the patient, the family member/significant other has been provided the following suicide prevention education, prior to the and/or following the discharge of the patient.  The suicide prevention education provided includes the following:  Suicide risk factors  Suicide prevention and interventions  National Suicide Hotline telephone number  [facility] assessment telephone number  [facility] Emergency Assistance 911  [facility] and/or Residential Mobile Crisis Unit telephone number  Request made of family/significant other to:  Remove weapons (e.g., guns, rifles, knives), all items previously/currently identified as safety concern.    Remove drugs/medications (over-the-counter, prescriptions, illicit drugs), all items previously/currently identified as a safety concern. No firearms at her home nor narcotics, plus patient will be discharging to shelter.   The family member/significant other verbalizes understanding of the suicide prevention education information provided.  The family member/significant other agrees to remove the items of safety concern listed above.  Clide Dales 06/01/2011, 6:15 PM

## 2011-06-02 MED ORDER — DIVALPROEX SODIUM ER 250 MG PO TB24
750.0000 mg | ORAL_TABLET | Freq: Every day | ORAL | Status: DC
  Administered 2011-06-03: 750 mg via ORAL
  Filled 2011-06-02 (×2): qty 3
  Filled 2011-06-02: qty 15

## 2011-06-02 NOTE — Progress Notes (Signed)
Patient states she slept well and currently has a good appetite. Patient rates her energy level as normal and her ability to pay attention as good. Patient rates depression as 4/10 and hopelessness as 1/10. Patient denies symptoms of withdrawal, denies SI/HI, also denies A/V hallucinations. Patient describes plan to "make sure I take my meds," after discharge. Patient states "I am ready to go." Support and encouragement given. Patient remains on Q15 checks for safety. Will continue to monitor.

## 2011-06-02 NOTE — Discharge Planning (Signed)
Sonya Roach called today from DSS to say that she has secured a bed at a shelter in [place] and she will pick Sonya Roach up tomorrow at 10:00 to provide transportation.  I will switch her appointment from [facility] to [facility] if I am able.  Sonya Roach's number is [phone].

## 2011-06-02 NOTE — Progress Notes (Signed)
06/02/2011         Time: 1415      Group Topic/Focus: The focus of this group is on discussing various styles of communication and communicating assertively using 'I' (feeling) statements.  Participation Level: Minimal  Participation Quality: Appropriate  Affect: Appropriate  Cognitive: Oriented   Additional Comments: Patient distracted when case manager came into group to talk to her, then followed the case manager out of the room and didn't return.  Misbah Hornaday 06/02/2011 3:47 PM

## 2011-06-02 NOTE — Progress Notes (Addendum)
[facility] Group Notes: (Counselor/Nursing/MHT/Case Management/Adjunct) 06/02/2011   @  11:00am  Finding Balance in Life  Type of Therapy:  Group Therapy  Participation Level:  Did Not Attend  Sonya Roach 06/02/2011   12:41 PM     [facility] Group Notes: (Counselor/Nursing/MHT/Case Management/Adjunct) 06/02/2011   @1 :15pm [facility]  Type of Therapy:  Group Therapy  Participation Level:  Limited  Participation Quality:  Attentive  Affect:  Blunted, Irritable  Cognitive:  Appropriate  Insight:  Limited  Engagement in Group: Limited  Engagement in Therapy:  Limited  Modes of Intervention:  Support and Exploration  Summary of Progress/Problems: Sonya Roach came to group late and was engaged, as evidenced by nodding along, but did not share personally.  Sonya Roach 06/02/2011 2:05 PM
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
## 2011-06-02 NOTE — Progress Notes (Signed)
[facility] Group Notes:  (Counselor/Nursing/MHT/Case Management/Adjunct)  06/02/2011 11:55 AM  Type of Therapy:  Group Therapy  Participation Level:  Did Not Attend   Sonya Roach C 06/02/2011, 11:55 AM  

## 2011-06-02 NOTE — Progress Notes (Signed)
Pt resting in bed with eyes closed.  No distress observed.  Safety maintained with q15 minute checks. 

## 2011-06-03 MED ORDER — DIVALPROEX SODIUM ER 250 MG PO TB24: 750.0000 mg | ORAL_TABLET | Freq: Every day | ORAL | Status: DC

## 2011-06-03 MED ORDER — RISPERIDONE 2 MG PO TABS: 2.0000 mg | ORAL_TABLET | Freq: Every day | ORAL | Status: DC

## 2011-06-03 NOTE — Progress Notes (Signed)
Patient denies SI/HI, denies A/V hallucinations, denies symptoms of withdrawal. Patient pleasant and cooperative, states she is excited about discharge today. Patient given support and encouragement. Patient verbalizes understanding of discharge instructions, medications and follow up care plan. Patient claimed all belongings from locker #25. Patient escorted out by staff, transported by Mother.

## 2011-06-03 NOTE — Progress Notes (Signed)
[facility] Group Notes: (Counselor/Nursing/MHT/Case Management/Adjunct)   06/03/2011 11:00am  Preventing Relapse   Type of Therapy: Group Therapy   Participation Level: Did Not Attend    [name]  06/03/2011 1:59 PM      [facility] Group Notes: (Counselor/Nursing/MHT/Case Management/Adjunct)   06/03/2011 1:15pm  [facility] [place]   Type of Therapy: Group Therapy   Participation Level: Did Not Attend    [name]  06/03/2011 2:00 PM     

## 2011-06-03 NOTE — Treatment Plan (Signed)
Interdisciplinary Treatment Plan Update (Adult)  Date: 06/03/2011  Time Reviewed: 8:20 AM   Progress in Treatment: Attending groups: Yes Participating in groups: Yes Taking medication as prescribed: Yes Tolerating medication: Yes   Family/Significant othe contact made:   Patient understands diagnosis:  Yes Discussing patient identified problems/goals with staff:  Yes Medical problems stabilized or resolved:  Yes Denies suicidal/homicidal ideation: Yes  In tx team Issues/concerns per patient self-inventory:  None Other:  New problem(s) identified: N/A  Reason for Continuation of Hospitalization: Other; describe D/C tdoay  Interventions implemented related to continuation of hospitalization:   Additional comments:  Estimated length of stay:D/C today  Discharge Plan: Go to the shelter, follow up outpt.  New goal(s): N/A  Review of initial/current patient goals per problem list:   1.  Goal(s):Eliminate SI  Met:  Yes  Target date:see previous tx plan  As evidenced by:  2.  Goal (s):Stabilize mood  Met:  Yes  Target date:5/23  As evidenced XB:[id number] reports her mood and anxiety are well under control  3.  Goal(s):Identify comprehensive mental wellness and sobriety plan  Met:  Yes  Target date:5/23  As evidenced by: [name] will stay away from certain friends in [place]; she will stay in the shelter in [place]. And follow up with [facility] and DSS.  4.  Goal(s):  Met:  Yes  Target date:  As evidenced by:  Attendees: Patient:  Sonya Roach 06/03/2011 8:20 AM  Family:     Physician:  Lupe Carney 06/03/2011 8:20 AM   Nursing:  Berneice Heinrich  06/03/2011 8:20 AM   Case Manager:  Richelle Ito, LCSW 06/03/2011 8:20 AM   Counselor:  Damita Dunnings 06/03/2011 8:20 AM   Other:     Other:     Other:     Other:      Scribe for Treatment Team:   Ida Rogue, 06/03/2011 8:20 AM

## 2011-06-03 NOTE — Progress Notes (Signed)
[facility] Group Notes:  (Counselor/Nursing/MHT/Case Management/Adjunct)  06/03/2011 1:00 PM  Type of Therapy:  Psych/Edu Group Therapy  Participation Level:  Did not attend    Sonya Roach C 06/03/2011, 2:00 PM  
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
## 2011-06-03 NOTE — Progress Notes (Signed)
[facility] Group Notes:  (Counselor/Nursing/MHT/Case Management/Adjunct)  06/03/2011 11:00 AM  Type of Therapy:  Group Therapy  Participation Level:  Minimal  Participation Quality:  Attentive and Sharing  Affect:  Blunted and Depressed  Cognitive:  Alert and Oriented  Insight:  Limited  Engagement in Group:  Limited  Engagement in Therapy:  Limited  Modes of Intervention:  Clarification, Orientation, Problem-solving and Support  Summary of Progress/Problems:  Therapist prompted Patients to identify barriers to recovery.  Patients engaged in a heated discussion which affirmed that until a person makes decides on their own and usually due to severe consequences of their addiction, not treatment will keep a person from using AOD.  One patient began using some negative language, that offended this patient.  She asked that the behavior stop, but argument continued and Pt left the group before resolution of the situation. Therapist offered support and encouragement.    Marni Griffon C 06/03/2011, 12:00 PM

## 2011-06-03 NOTE — Progress Notes (Signed)
Pt has been calm and cooperative this evening. Pt attended karaoke this evening. Pt presents with no concerns she wishes for this writer to address at this time. Pt denies having any psychosocial symptoms at this time. Continued support and availability as needed extended to this pt. Pt safety remains with q58min checks.

## 2011-06-03 NOTE — Progress Notes (Signed)
[facility] Case Management Discharge Plan:  Will you be returning to the same living situation after discharge: No. At discharge, do you have transportation home?:Yes,  mother Do you have the ability to pay for your medications:Yes,  mental health  Interagency Information:     Release of information consent forms completed and in the chart;  Patient's signature needed at discharge.  Patient to Follow up at:  Follow-up Information    Follow up with [facility] on 06/07/2011. (At noon.  future appointments after this one will be in [place])    Contact information:   [address]  [place] [phone]  [336] [phone]         Patient denies SI/HI:   Yes,  yes    Safety Planning and Suicide Prevention discussed:  Yes,  yes  Barrier to discharge identified:No.  Summary and Recommendations:   Sonya Roach 06/03/2011, 12:07 PM

## 2011-06-09 NOTE — Progress Notes (Signed)
Patient Discharge Instructions:  After Visit Summary (AVS):   Faxed to:  06/07/2011 Psychiatric Admission Assessment Note:   Faxed to:  06/07/2011 Suicide Risk Assessment - Discharge Assessment:   Faxed to:  06/07/2011 Faxed/Sent to the Next Level Care provider:  06/07/2011  Faxed to [facility] @ [phone]  Wandra Scot, 06/09/2011, 12:43 PM

## 2011-07-26 ENCOUNTER — Emergency Department (HOSPITAL_COMMUNITY)
Admission: EM | Admit: 2011-07-26 | Discharge: 2011-07-26 | Disposition: A | Discharge status: Home / Self Care | Payer: Medicaid Other | Attending: Emergency Medicine | Admitting: Emergency Medicine

## 2011-07-26 ENCOUNTER — Encounter (HOSPITAL_COMMUNITY): Payer: Self-pay

## 2011-07-26 DIAGNOSIS — L089 Local infection of the skin and subcutaneous tissue, unspecified: Secondary | ICD-10-CM

## 2011-07-26 DIAGNOSIS — F319 Bipolar disorder, unspecified: Secondary | ICD-10-CM | POA: Insufficient documentation

## 2011-07-26 DIAGNOSIS — IMO0001 Reserved for inherently not codable concepts without codable children: Secondary | ICD-10-CM | POA: Insufficient documentation

## 2011-07-26 DIAGNOSIS — F17200 Nicotine dependence, unspecified, uncomplicated: Secondary | ICD-10-CM | POA: Insufficient documentation

## 2011-07-26 DIAGNOSIS — F429 Obsessive-compulsive disorder, unspecified: Secondary | ICD-10-CM | POA: Insufficient documentation

## 2011-07-26 MED ORDER — SULFAMETHOXAZOLE-TRIMETHOPRIM 800-160 MG PO TABS: 1.0000 | ORAL_TABLET | Freq: Two times a day (BID) | ORAL | Status: AC

## 2011-07-26 NOTE — ED Notes (Signed)
Pt thinks a spider bit her r forearm a couple of days ago.  Pt has red raised area on forearm with dark center.  Area tender to touch.

## 2011-07-26 NOTE — ED Notes (Signed)
Pt thinks a spider bit her the other night, noted a red area to right forearm since yesterday

## 2011-07-26 NOTE — ED Provider Notes (Signed)
History    This chart was scribed for [name], MD, MD by Smitty Pluck. The patient was seen in room APA11 and the patient's care was started at 1:39PM.   CSN: [id number]  Arrival date & time 07/26/11  1300   First MD Initiated Contact with Patient 07/26/11 1329      Chief Complaint  Patient presents with  . Insect Bite    (Consider location/radiation/quality/duration/timing/severity/associated sxs/prior treatment) Patient is a 26 y.o. female presenting with rash. The history is provided by the patient. No language interpreter was used.  Rash  This is a new problem. The current episode started 2 days ago. The problem has not changed since onset.The problem is associated with nothing. There has been no fever. Affected Location: left forearm. The pain is at a severity of 2/10. The pain is mild. The pain has been constant since onset.   Sonya Roach is a 26 y.o. female who presents to the Emergency Department complaining of bug bite on right forearm onset 2 days ago with symptoms worsening today. Pt reports soreness in area. Symptoms have been constant. Denies radiation. Denies n/v/d, fever, cough and sore throat.   Past Medical History  Diagnosis Date  . OCD (obsessive compulsive disorder)   . Bipolar 1 disorder     Past Surgical History  Procedure Date  . Tubal ligation     No family history on file.  History  Substance Use Topics  . Smoking status: Current Everyday Smoker -- 1.0 packs/day  . Smokeless tobacco: Not on file  . Alcohol Use: No    OB History    Grav Para Term Preterm Abortions TAB SAB Ect Mult Living                  Review of Systems  Skin: Positive for rash.  All other systems reviewed and are negative.   10 Systems reviewed and all are negative for acute change except as noted in the HPI.   Allergies  Review of patient's allergies indicates no known allergies.  Home Medications   Current Outpatient Rx  Name Route Sig Dispense  Refill  . DIVALPROEX SODIUM ER 250 MG PO TB24 Oral Take 3 tablets (750 mg total) by mouth daily. For mood stability 90 tablet 0  . RISPERIDONE 2 MG PO TABS Oral Take 1 tablet (2 mg total) by mouth at bedtime. For mood stability 30 tablet 0    BP 101/60  Pulse 100  Temp 98 F (36.7 C) (Oral)  Resp 18  Ht 4\' 10"  (1.473 m)  Wt 150 lb (68.04 kg)  BMI 31.35 kg/m2  SpO2 100%  LMP 07/12/2011  Physical Exam  Nursing note and vitals reviewed. Constitutional: She is oriented to person, place, and time. She appears well-developed.  HENT:  Head: Normocephalic.  Eyes: Conjunctivae are normal.  Neck: No tracheal deviation present.  Cardiovascular:  No murmur heard. Musculoskeletal: Normal range of motion.  Neurological: She is oriented to person, place, and time.  Skin: Skin is warm.       Right forearm has 2 cm area with redness, mild swelling, dark center. Appears to be possible bug btie  Psychiatric: She has a normal mood and affect.    ED Course  Procedures (including critical care time) DIAGNOSTIC STUDIES: Oxygen Saturation is 100% on room air, normal by my interpretation.    COORDINATION OF CARE:    Labs Reviewed - No data to display No results found.   No diagnosis  found.    MDM   The chart was scribed for me under my direct supervision.  I personally performed the history, physical, and medical decision making and all procedures in the evaluation of this patient.[name], MD 07/26/11 1440

## 2011-07-29 ENCOUNTER — Encounter (HOSPITAL_COMMUNITY): Payer: Self-pay | Admitting: *Deleted

## 2011-07-29 ENCOUNTER — Emergency Department (HOSPITAL_COMMUNITY)
Admission: EM | Admit: 2011-07-29 | Discharge: 2011-07-29 | Disposition: A | Discharge status: Home / Self Care | Payer: Medicaid Other | Attending: Emergency Medicine | Admitting: Emergency Medicine

## 2011-07-29 DIAGNOSIS — F429 Obsessive-compulsive disorder, unspecified: Secondary | ICD-10-CM | POA: Insufficient documentation

## 2011-07-29 DIAGNOSIS — F319 Bipolar disorder, unspecified: Secondary | ICD-10-CM | POA: Insufficient documentation

## 2011-07-29 DIAGNOSIS — IMO0002 Reserved for concepts with insufficient information to code with codable children: Secondary | ICD-10-CM | POA: Insufficient documentation

## 2011-07-29 DIAGNOSIS — L0291 Cutaneous abscess, unspecified: Secondary | ICD-10-CM

## 2011-07-29 DIAGNOSIS — F17200 Nicotine dependence, unspecified, uncomplicated: Secondary | ICD-10-CM | POA: Insufficient documentation

## 2011-07-29 MED ORDER — LIDOCAINE-EPINEPHRINE-TETRACAINE (LET) SOLUTION
3.0000 mL | Freq: Once | NASAL | Status: AC
  Administered 2011-07-29: 3 mL via TOPICAL
  Filled 2011-07-29: qty 3

## 2011-07-29 MED ORDER — HYDROCODONE-ACETAMINOPHEN 5-325 MG PO TABS: ORAL_TABLET | ORAL | Status: AC

## 2011-07-29 NOTE — ED Provider Notes (Signed)
Medical screening examination/treatment/procedure(s) were performed by non-physician practitioner and as supervising physician I was immediately available for consultation/collaboration.  Flint Melter, MD 07/29/11 2137

## 2011-07-29 NOTE — ED Notes (Signed)
Abscess to rt forearm x 1 week. headache

## 2011-07-29 NOTE — ED Provider Notes (Signed)
History     CSN: [id number]  Arrival date & time 07/29/11  1546   First MD Initiated Contact with Patient 07/29/11 1631      Chief Complaint  Patient presents with  . Abscess    (Consider location/radiation/quality/duration/timing/severity/associated sxs/prior treatment) HPI Comments: Patient returns to the ER for recheck of an abscess to her right forearm. She states she was seen here one week ago for same symptoms. She states the surrounding redness has improved but she is still having pain to the area of the abscess. She denies fever, chills, weakness or numbness of her right arm or hand. She states she is taking the antibiotics as directed.  Patient is a 26 y.o. female presenting with wound check. The history is provided by the patient.  Wound Check  She was treated in the ED 5 to 10 days ago. Previous treatment in the ED includes oral antibiotics. There has been no treatment since the wound repair. Fever duration: No fever. There has been no drainage from the wound. The redness has improved. The swelling has improved. The pain has not changed. She has no difficulty moving the affected extremity or digit.    Past Medical History  Diagnosis Date  . OCD (obsessive compulsive disorder)   . Bipolar 1 disorder     Past Surgical History  Procedure Date  . Tubal ligation     History reviewed. No pertinent family history.  History  Substance Use Topics  . Smoking status: Current Everyday Smoker -- 1.0 packs/day  . Smokeless tobacco: Not on file  . Alcohol Use: No    OB History    Grav Para Term Preterm Abortions TAB SAB Ect Mult Living                  Review of Systems  Constitutional: Negative for fever and chills.  Genitourinary: Negative for dysuria and difficulty urinating.  Musculoskeletal: Negative for joint swelling and arthralgias.  Skin: Positive for wound. Negative for color change.       Abscess  All other systems reviewed and are  negative.    Allergies  Review of patient's allergies indicates no known allergies.  Home Medications   Current Outpatient Rx  Name Route Sig Dispense Refill  . DIVALPROEX SODIUM ER 250 MG PO TB24 Oral Take 3 tablets (750 mg total) by mouth daily. For mood stability 90 tablet 0  . RISPERIDONE 1 MG PO TABS Oral Take 1 mg by mouth at bedtime.    . SULFAMETHOXAZOLE-TRIMETHOPRIM 800-160 MG PO TABS Oral Take 1 tablet by mouth every 12 (twelve) hours. 20 tablet 0  . HYDROCODONE-ACETAMINOPHEN 5-325 MG PO TABS  Take one-two tabs po q 4-6 hrs prn pain 12 tablet 0    BP 92/63  Pulse 67  Temp 98.1 F (36.7 C) (Oral)  Resp 16  Ht 4\' 10"  (1.473 m)  Wt 150 lb (68.04 kg)  BMI 31.35 kg/m2  SpO2 100%  LMP 07/12/2011  Physical Exam  Nursing note and vitals reviewed. Constitutional: She is oriented to person, place, and time. She appears well-developed and well-nourished. No distress.  HENT:  Head: Normocephalic and atraumatic.  Cardiovascular: Normal rate, regular rhythm and normal heart sounds.   Pulmonary/Chest: Effort normal and breath sounds normal.  Neurological: She is alert and oriented to person, place, and time. She exhibits normal muscle tone. Coordination normal.  Skin: Skin is warm. There is erythema.          Abscess to the distal right  forearm. No surrounding erythema or edema. Distal sensation is intact, radial pulse is brisk, patient has full range of motion of the wrist and fingers. Grip strengths are symmetrical and strong. Cap refill is less than < 2 sec    ED Course  Procedures (including critical care time)  Labs Reviewed - No data to display   1. Abscess       MDM    Dime sized abscess to the dorsal right forearm.  No fluctuance or surrounding erythema.  Local anesthesia was obtained using LET, Small scabbed area to the center that was cleaned by me using betadine and saline.  Area was de-roofed by me using an 18 g needle,  Debrided, and then bandaged.   Advise patient to continue current antibiotics as directed and to soak her arm in warm water several times a day. She agrees to return to the ER if her symptoms worsen.  The patient appears reasonably screened and/or stabilized for discharge and I doubt any other medical condition or other [facility] requiring further screening, evaluation, or treatment in the ED at this time prior to discharge.    Prescribed: norco #12      Kalii Chesmore L. [place], [place] 07/29/11 1804

## 2012-01-18 ENCOUNTER — Emergency Department (HOSPITAL_COMMUNITY)
Admission: EM | Admit: 2012-01-18 | Discharge: 2012-01-19 | Disposition: A | Discharge status: Home / Self Care | Payer: Medicaid Other | Attending: Emergency Medicine | Admitting: Emergency Medicine

## 2012-01-18 ENCOUNTER — Emergency Department (HOSPITAL_COMMUNITY): Payer: Medicaid Other

## 2012-01-18 ENCOUNTER — Encounter (HOSPITAL_COMMUNITY): Payer: Self-pay | Admitting: *Deleted

## 2012-01-18 DIAGNOSIS — R51 Headache: Secondary | ICD-10-CM

## 2012-01-18 DIAGNOSIS — F319 Bipolar disorder, unspecified: Secondary | ICD-10-CM | POA: Insufficient documentation

## 2012-01-18 DIAGNOSIS — F17200 Nicotine dependence, unspecified, uncomplicated: Secondary | ICD-10-CM | POA: Insufficient documentation

## 2012-01-18 DIAGNOSIS — Z79899 Other long term (current) drug therapy: Secondary | ICD-10-CM | POA: Insufficient documentation

## 2012-01-18 DIAGNOSIS — R112 Nausea with vomiting, unspecified: Secondary | ICD-10-CM | POA: Insufficient documentation

## 2012-01-18 DIAGNOSIS — Z8659 Personal history of other mental and behavioral disorders: Secondary | ICD-10-CM | POA: Insufficient documentation

## 2012-01-18 MED ORDER — DIPHENHYDRAMINE HCL 50 MG/ML IJ SOLN
25.0000 mg | Freq: Once | INTRAMUSCULAR | Status: AC
  Administered 2012-01-18: 25 mg via INTRAVENOUS
  Filled 2012-01-18: qty 1

## 2012-01-18 MED ORDER — METOCLOPRAMIDE HCL 5 MG/ML IJ SOLN
10.0000 mg | Freq: Once | INTRAMUSCULAR | Status: AC
  Administered 2012-01-18: 10 mg via INTRAVENOUS
  Filled 2012-01-18: qty 2

## 2012-01-18 MED ORDER — DEXAMETHASONE SODIUM PHOSPHATE 4 MG/ML IJ SOLN
4.0000 mg | Freq: Once | INTRAMUSCULAR | Status: AC
  Administered 2012-01-18: 4 mg via INTRAVENOUS
  Filled 2012-01-18: qty 1

## 2012-01-18 MED ORDER — KETOROLAC TROMETHAMINE 30 MG/ML IJ SOLN
30.0000 mg | Freq: Once | INTRAMUSCULAR | Status: AC
  Administered 2012-01-18: 30 mg via INTRAVENOUS
  Filled 2012-01-18: qty 1

## 2012-01-18 MED ORDER — SODIUM CHLORIDE 0.9 % IV SOLN
Freq: Once | INTRAVENOUS | Status: AC
  Administered 2012-01-18: 75 mL/h via INTRAVENOUS

## 2012-01-18 NOTE — ED Notes (Signed)
Pt with HA for 8 weeks, vomiting today, denies having a PCP to see for it, pain to mid forehead, states sensitive to light, states that tylenol and motrin does not touch the pain

## 2012-01-18 NOTE — ED Provider Notes (Signed)
History     CSN: [id number]  Arrival date & time 01/18/12  2121   First MD Initiated Contact with Patient 01/18/12 2306      Chief Complaint  Patient presents with  . Headache    (Consider location/radiation/quality/duration/timing/severity/associated sxs/prior treatment) HPI Sonya Roach is a 27 y.o. female who presents to the Emergency Department complaining of headache for 8 weeks that has been continuous. She has taken tylenol, BC powders, ibuprofen, aleve, with no relief. In last two days she has been experiencing nausea and vomiting. Denies vision changes but is light sensitive, no hearing loss, extremity weakness, numbness, tingling, fever, chills, cough, shortness of breath.    Past Medical History  Diagnosis Date  . OCD (obsessive compulsive disorder)   . Bipolar 1 disorder     Past Surgical History  Procedure Date  . Tubal ligation   . Dilitation and curretage     History reviewed. No pertinent family history.  History  Substance Use Topics  . Smoking status: Current Every Day Smoker -- 1.0 packs/day  . Smokeless tobacco: Not on file  . Alcohol Use: No    OB History    Grav Para Term Preterm Abortions TAB SAB Ect Mult Living                  Review of Systems  Constitutional: Negative for fever.       10 Systems reviewed and are negative for acute change except as noted in the HPI.  HENT: Negative for congestion.   Eyes: Negative for discharge and redness.  Respiratory: Negative for cough and shortness of breath.   Cardiovascular: Negative for chest pain.  Gastrointestinal: Positive for nausea and vomiting. Negative for abdominal pain.  Musculoskeletal: Negative for back pain.  Skin: Negative for rash.  Neurological: Positive for headaches. Negative for syncope and numbness.  Psychiatric/Behavioral:       No behavior change.    Allergies  Review of patient's allergies indicates no known allergies.  Home Medications   Current Outpatient  Rx  Name  Route  Sig  Dispense  Refill  . ACETAMINOPHEN 500 MG PO TABS   Oral   Take 500 mg by mouth every 6 (six) hours as needed.         . IBUPROFEN 200 MG PO TABS   Oral   Take 200 mg by mouth every 6 (six) hours as needed.         Marland Kitchen NAPROXEN SODIUM 220 MG PO CAPS   Oral   Take 220 mg by mouth daily as needed. For pain/relief           BP 98/66  Pulse 97  Temp 97.7 F (36.5 C)  Resp 18  Ht 4\' 10"  (1.473 m)  Wt 148 lb (67.132 kg)  BMI 30.93 kg/m2  SpO2 100%  LMP 01/04/2012  Physical Exam  Nursing note and vitals reviewed. Constitutional: She is oriented to person, place, and time. She appears well-developed and well-nourished.       Awake, alert, nontoxic appearance.  HENT:  Head: Atraumatic.  Eyes: Right eye exhibits no discharge. Left eye exhibits no discharge.  Neck: Neck supple.  Cardiovascular: Normal heart sounds.   Pulmonary/Chest: Effort normal and breath sounds normal. She exhibits no tenderness.  Abdominal: Soft. There is no tenderness. There is no rebound.  Musculoskeletal: She exhibits no tenderness.       Baseline ROM, no obvious new focal weakness.  Neurological: She is alert and oriented to  person, place, and time. She has normal reflexes. No cranial nerve deficit. Coordination normal.       Mental status and motor strength appears baseline for patient and situation.  Skin: No rash noted.  Psychiatric: She has a normal mood and affect.    ED Course  Procedures (including critical care time)  Ct Head Wo Contrast  01/19/2012  *RADIOLOGY REPORT*  Clinical Data: Right frontal headache for 78 weeks.  CT HEAD WITHOUT CONTRAST  Technique:  Contiguous axial images were obtained from the base of the skull through the vertex without contrast.  Comparison: Head CT scan 02/28/2008.  Findings: Focus of hypoattenuation in the subcortical deep white matter of the left frontal lobe is unchanged.  No evidence of acute abnormality including infarct, hemorrhage,  mass lesion, mass effect, midline shift or abnormal extra-axial fluid collection.  No hydrocephalus or pneumocephalus.  There is mucosal thickening of the frontal sinuses.  IMPRESSION:  1.  No acute finding. 2.  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 3.  Mucosal thickening of the frontal sinuses.   Original Report Authenticated By: Holley Dexter, M.D.      MDM  Patient with headache x 8 weeks. CT without acute findings. Given decadron, reglan, benadryl and toradol with relief of the headache. Reviewed results with patient.  Pt feels improved after observation and/or treatment in ED.Pt stable in ED with no significant deterioration in condition.The patient appears reasonably screened and/or stabilized for discharge and I doubt any other medical condition or other [facility] requiring further screening, evaluation, or treatment in the ED at this time prior to discharge.  MDM Reviewed: nursing note and vitals Interpretation: CT scan           Nicoletta Dress. Colon Branch, MD 01/19/12 1610

## 2012-01-18 NOTE — ED Notes (Signed)
Headache for 7-8 weeks, vomited x6 today, no diarrhea.No fever.  No head injury

## 2012-01-19 NOTE — ED Notes (Signed)
Patient informed this RN and discharging RN that she had called her ride to come pick her up.  Reported by another employee that patient was observed getting into her car and driving.  Security notified.

## 2012-03-29 ENCOUNTER — Emergency Department (HOSPITAL_COMMUNITY): Payer: Medicaid Other

## 2012-03-29 ENCOUNTER — Encounter (HOSPITAL_COMMUNITY): Payer: Self-pay | Admitting: *Deleted

## 2012-03-29 ENCOUNTER — Emergency Department (HOSPITAL_COMMUNITY)
Admission: EM | Admit: 2012-03-29 | Discharge: 2012-03-29 | Disposition: A | Discharge status: Home / Self Care | Payer: Medicaid Other | Attending: Emergency Medicine | Admitting: Emergency Medicine

## 2012-03-29 DIAGNOSIS — S3992XA Unspecified injury of lower back, initial encounter: Secondary | ICD-10-CM

## 2012-03-29 DIAGNOSIS — F17200 Nicotine dependence, unspecified, uncomplicated: Secondary | ICD-10-CM | POA: Insufficient documentation

## 2012-03-29 DIAGNOSIS — Y9389 Activity, other specified: Secondary | ICD-10-CM | POA: Insufficient documentation

## 2012-03-29 DIAGNOSIS — Y9289 Other specified places as the place of occurrence of the external cause: Secondary | ICD-10-CM | POA: Insufficient documentation

## 2012-03-29 DIAGNOSIS — Z8659 Personal history of other mental and behavioral disorders: Secondary | ICD-10-CM | POA: Insufficient documentation

## 2012-03-29 DIAGNOSIS — X503XXA Overexertion from repetitive movements, initial encounter: Secondary | ICD-10-CM | POA: Insufficient documentation

## 2012-03-29 DIAGNOSIS — IMO0002 Reserved for concepts with insufficient information to code with codable children: Secondary | ICD-10-CM | POA: Insufficient documentation

## 2012-03-29 MED ORDER — CYCLOBENZAPRINE HCL 10 MG PO TABS: 10.0000 mg | ORAL_TABLET | Freq: Two times a day (BID) | ORAL | Status: DC | PRN

## 2012-03-29 MED ORDER — CYCLOBENZAPRINE HCL 10 MG PO TABS
10.0000 mg | ORAL_TABLET | Freq: Once | ORAL | Status: AC
  Administered 2012-03-29: 10 mg via ORAL
  Filled 2012-03-29: qty 1

## 2012-03-29 MED ORDER — OXYCODONE-ACETAMINOPHEN 5-325 MG PO TABS
1.0000 | ORAL_TABLET | Freq: Once | ORAL | Status: AC
  Administered 2012-03-29: 1 via ORAL
  Filled 2012-03-29: qty 1

## 2012-03-29 MED ORDER — HYDROCODONE-ACETAMINOPHEN 5-325 MG PO TABS: 1.0000 | ORAL_TABLET | ORAL | Status: DC | PRN

## 2012-03-29 NOTE — ED Provider Notes (Signed)
History     CSN: [id number]  Arrival date & time 03/29/12  0901   First MD Initiated Contact with Patient 03/29/12 [phone]      Chief Complaint  Patient presents with  . Back Pain    (Consider location/radiation/quality/duration/timing/severity/associated sxs/prior treatment) Patient is a 27 y.o. female presenting with back pain. The history is provided by the patient.  Back Pain Location:  Lumbar spine Quality:  Shooting and stabbing Radiates to:  L posterior upper leg Pain severity:  Severe Pain is:  Same all the time Duration:  1 day Timing:  Constant Progression:  Worsening Chronicity:  New Context: lifting heavy objects and physical stress   Relieved by:  Nothing Worsened by:  Bending, deep breathing, movement, twisting and touching Ineffective treatments:  Ibuprofen and being still Associated symptoms: no abdominal pain, no chest pain, no fever and no headaches    Patients states that she was lifting her child that weighs over 40 pounds into the car seat and twisted and felt a pop in her lower back yesterday morning. The pain was severe but she went to work and tried to work 12 hours. Last night she took 800 mg of Motrin and went to bed. The pain continues all night and this morning is severe. The pain starts over the lower spine and radiates to the right buttock.   Past Medical History  Diagnosis Date  . OCD (obsessive compulsive disorder)   . Bipolar 1 disorder     Past Surgical History  Procedure Laterality Date  . Tubal ligation    . Dilitation and curretage      No family history on file.  History  Substance Use Topics  . Smoking status: Current Every Day Smoker -- 1.00 packs/day  . Smokeless tobacco: Not on file  . Alcohol Use: No    OB History   Grav Para Term Preterm Abortions TAB SAB Ect Mult Living                  Review of Systems  Constitutional: Negative for fever and chills.  HENT: Negative.   Respiratory: Negative for cough and  shortness of breath.   Cardiovascular: Negative for chest pain.  Gastrointestinal: Negative for nausea, vomiting and abdominal pain.  Musculoskeletal: Positive for back pain. Gait problem: due to pain.  Skin: Negative for wound.  Allergic/Immunologic: Negative for immunocompromised state.  Neurological: Negative for dizziness and headaches.  Psychiatric/Behavioral: Nervous/anxious: hx of OCD and Bipolar.     Allergies  Review of patient's allergies indicates no known allergies.  Home Medications   Current Outpatient Rx  Name  Route  Sig  Dispense  Refill  . acetaminophen (TYLENOL) 500 MG tablet   Oral   Take 500 mg by mouth every 6 (six) hours as needed.         Marland Kitchen ibuprofen (ADVIL,MOTRIN) 200 MG tablet   Oral   Take 200 mg by mouth every 6 (six) hours as needed.         . Naproxen Sodium (ALEVE) 220 MG CAPS   Oral   Take 220 mg by mouth daily as needed. For pain/relief           BP 121/72  Pulse 90  Temp(Src) 98.6 F (37 C) (Oral)  Resp 16  Ht 4\' 10"  (1.473 m)  Wt 148 lb (67.132 kg)  BMI 30.94 kg/m2  SpO2 96%  LMP 03/23/2012  Physical Exam  Nursing note and vitals reviewed. Constitutional: She is oriented to  person, place, and time. She appears well-developed and well-nourished. No distress.  Patient crying and appears very uncomfortable.  HENT:  Head: Normocephalic and atraumatic.  Eyes: EOM are normal. Pupils are equal, round, and reactive to light.  Neck: Normal range of motion. Neck supple.  Cardiovascular: Normal rate and regular rhythm.   Pulmonary/Chest: Effort normal and breath sounds normal.  Abdominal: Soft. There is no tenderness.  Musculoskeletal: She exhibits no edema.       Lumbar back: She exhibits decreased range of motion, tenderness and bony tenderness.       Back:  Pain radiates to the right buttock  Neurological: She is alert and oriented to person, place, and time. She has normal strength and normal reflexes. No cranial nerve  deficit or sensory deficit.  Pedal pulses strong and equal bilateral. Adequate circulation, good touch sensation.  Skin: Skin is warm and dry.  Psychiatric: She has a normal mood and affect. Her behavior is normal. Judgment and thought content normal.    ED Course  Procedures (including critical care time) Dg Lumbar Spine Complete  03/29/2012  *RADIOLOGY REPORT*  Clinical Data: Back pain.  LUMBAR SPINE - COMPLETE 4+ VIEW  Comparison: None  Findings: There is a mild left convex lumbar scoliosis.  The lateral film demonstrates normal alignment.  Disc spaces and vertebral bodies are maintained.  The facets are normally aligned. No pars defects.  The visualized bony pelvis is normal.  IMPRESSION:  1.  Mild left convex lumbar scoliosis. 2.  No acute bony findings or degenerative changes.   Original Report Authenticated By: Rudie Meyer, M.D.      MDM  Patient improved after pain medication and flexeril, x-ray with no acute findings. Will treat symptoms and have patient follow up with Dr. Romeo Apple prn  Assessment: 27 y.o. female with low back pain   Muscle strain  Plan:  Pain management and muscle relaxant   She will continue ibuprofen   Follow up with ortho prn I have reviewed this patient's vital signs, nurses notes, appropriate labs and imaging.  I have discussed findings and plan of care with the patient and she voices understanding   Medication List    TAKE these medications       cyclobenzaprine 10 MG tablet  Commonly known as:  FLEXERIL  Take 1 tablet (10 mg total) by mouth 2 (two) times daily as needed for muscle spasms.     HYDROcodone-acetaminophen 5-325 MG per tablet  Commonly known as:  NORCO/VICODIN  Take 1 tablet by mouth every 4 (four) hours as needed.      ASK your doctor about these medications       ibuprofen 200 MG tablet  Commonly known as:  ADVIL,MOTRIN  Take 800 mg by mouth daily as needed for pain.               [place], NP 03/29/12  [address] [place], [place] 03/29/12 1112

## 2012-03-29 NOTE — ED Notes (Signed)
Pt states lower back pain x 2 days, NAD>

## 2012-03-29 NOTE — Discharge Instructions (Signed)
Your x-ray today shows no fractures or disc problem. Continue to take your ibuprofen. In addition take the medication we give you. Do not take the narcotic or muscle relaxant if your are driving or working as it will make you sleepy. If your pain continues follow up with Dr. Romeo Apple.

## 2012-03-29 NOTE — ED Notes (Signed)
Pt states she feels as though she may have injured her back from lifting and transporting her daughter.Sharp Pain located in central back, lower lumber spinal area that radiates to her buttocks. She took 800 mg motrin last night but no relief. Denies abd and chest pain. Pt states it hurts to take a deep breath.

## 2012-03-29 NOTE — ED Provider Notes (Signed)
Medical screening examination/treatment/procedure(s) were performed by non-physician practitioner and as supervising physician I was immediately available for consultation/collaboration.   Shelda Jakes, MD 03/29/12 2026

## 2012-07-17 ENCOUNTER — Emergency Department (HOSPITAL_COMMUNITY): Payer: Medicaid Other

## 2012-07-17 ENCOUNTER — Emergency Department (HOSPITAL_COMMUNITY)
Admission: EM | Admit: 2012-07-17 | Discharge: 2012-07-18 | Disposition: A | Discharge status: Other Acute Inpatient Hospital | Payer: Medicaid Other | Attending: Emergency Medicine | Admitting: Emergency Medicine

## 2012-07-17 DIAGNOSIS — T148XXA Other injury of unspecified body region, initial encounter: Secondary | ICD-10-CM

## 2012-07-17 DIAGNOSIS — Y9389 Activity, other specified: Secondary | ICD-10-CM | POA: Insufficient documentation

## 2012-07-17 DIAGNOSIS — R1110 Vomiting, unspecified: Secondary | ICD-10-CM | POA: Insufficient documentation

## 2012-07-17 DIAGNOSIS — S161XXA Strain of muscle, fascia and tendon at neck level, initial encounter: Secondary | ICD-10-CM

## 2012-07-17 DIAGNOSIS — Z3202 Encounter for pregnancy test, result negative: Secondary | ICD-10-CM | POA: Insufficient documentation

## 2012-07-17 DIAGNOSIS — Y92410 Unspecified street and highway as the place of occurrence of the external cause: Secondary | ICD-10-CM | POA: Insufficient documentation

## 2012-07-17 DIAGNOSIS — S46911A Strain of unspecified muscle, fascia and tendon at shoulder and upper arm level, right arm, initial encounter: Secondary | ICD-10-CM

## 2012-07-17 DIAGNOSIS — R45851 Suicidal ideations: Secondary | ICD-10-CM

## 2012-07-17 DIAGNOSIS — IMO0002 Reserved for concepts with insufficient information to code with codable children: Secondary | ICD-10-CM | POA: Insufficient documentation

## 2012-07-17 DIAGNOSIS — F329 Major depressive disorder, single episode, unspecified: Secondary | ICD-10-CM

## 2012-07-17 DIAGNOSIS — Z91199 Patient's noncompliance with other medical treatment and regimen due to unspecified reason: Secondary | ICD-10-CM | POA: Insufficient documentation

## 2012-07-17 DIAGNOSIS — Z9119 Patient's noncompliance with other medical treatment and regimen: Secondary | ICD-10-CM | POA: Insufficient documentation

## 2012-07-17 DIAGNOSIS — F17200 Nicotine dependence, unspecified, uncomplicated: Secondary | ICD-10-CM | POA: Insufficient documentation

## 2012-07-17 DIAGNOSIS — T07XXXA Unspecified multiple injuries, initial encounter: Secondary | ICD-10-CM

## 2012-07-17 DIAGNOSIS — F319 Bipolar disorder, unspecified: Secondary | ICD-10-CM | POA: Insufficient documentation

## 2012-07-17 DIAGNOSIS — S01502A Unspecified open wound of oral cavity, initial encounter: Secondary | ICD-10-CM | POA: Insufficient documentation

## 2012-07-17 DIAGNOSIS — R51 Headache: Secondary | ICD-10-CM | POA: Insufficient documentation

## 2012-07-17 DIAGNOSIS — S139XXA Sprain of joints and ligaments of unspecified parts of neck, initial encounter: Secondary | ICD-10-CM | POA: Insufficient documentation

## 2012-07-17 DIAGNOSIS — F32A Depression, unspecified: Secondary | ICD-10-CM

## 2012-07-17 DIAGNOSIS — F429 Obsessive-compulsive disorder, unspecified: Secondary | ICD-10-CM | POA: Insufficient documentation

## 2012-07-17 LAB — COMPREHENSIVE METABOLIC PANEL
ALT: 19 U/L (ref 0–35)
AST: 42 U/L — ABNORMAL HIGH (ref 0–37)
Calcium: 9.7 mg/dL (ref 8.4–10.5)
Sodium: 137 mEq/L (ref 135–145)
Total Protein: 7.5 g/dL (ref 6.0–8.3)

## 2012-07-17 LAB — RAPID URINE DRUG SCREEN, HOSP PERFORMED
Amphetamines: NOT DETECTED
Cocaine: NOT DETECTED
Opiates: NOT DETECTED
Tetrahydrocannabinol: POSITIVE — AB

## 2012-07-17 LAB — CBC WITH DIFFERENTIAL/PLATELET
Basophils Absolute: 0 10*3/uL (ref 0.0–0.1)
Basophils Relative: 0 % (ref 0–1)
Eosinophils Absolute: 0.1 10*3/uL (ref 0.0–0.7)
Eosinophils Relative: 1 % (ref 0–5)
MCH: 30.5 pg (ref 26.0–34.0)
MCHC: 33.9 g/dL (ref 30.0–36.0)
MCV: 89.9 fL (ref 78.0–100.0)
Platelets: 269 10*3/uL (ref 150–400)
RDW: 14.2 % (ref 11.5–15.5)
WBC: 16.8 10*3/uL — ABNORMAL HIGH (ref 4.0–10.5)

## 2012-07-17 MED ORDER — NICOTINE 21 MG/24HR TD PT24
21.0000 mg | MEDICATED_PATCH | Freq: Once | TRANSDERMAL | Status: DC
  Filled 2012-07-17: qty 1

## 2012-07-17 MED ORDER — BACITRACIN ZINC 500 UNIT/GM EX OINT
TOPICAL_OINTMENT | Freq: Two times a day (BID) | CUTANEOUS | Status: DC
  Administered 2012-07-17: 21:00:00 via TOPICAL
  Filled 2012-07-17: qty 15

## 2012-07-17 MED ORDER — ONDANSETRON HCL 4 MG PO TABS: 4.0000 mg | ORAL_TABLET | Freq: Three times a day (TID) | ORAL | Status: DC | PRN

## 2012-07-17 MED ORDER — LORAZEPAM 1 MG PO TABS
1.0000 mg | ORAL_TABLET | Freq: Three times a day (TID) | ORAL | Status: DC | PRN
  Administered 2012-07-17: 1 mg via ORAL
  Filled 2012-07-17: qty 1

## 2012-07-17 MED ORDER — ACETAMINOPHEN 325 MG PO TABS
650.0000 mg | ORAL_TABLET | Freq: Once | ORAL | Status: AC
  Administered 2012-07-17: 650 mg via ORAL
  Filled 2012-07-17: qty 2
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
## 2012-07-17 NOTE — ED Notes (Signed)
Patient refused bedpan to give urine sample

## 2012-07-17 NOTE — ED Notes (Signed)
Went to [facility] today because she had multiple self inflicted lacerations to left forearm with razor--not because she wanted to kill self, but because she "wanted to feel something"

## 2012-07-18 ENCOUNTER — Inpatient Hospital Stay (HOSPITAL_COMMUNITY)
Admission: EM | Admit: 2012-07-18 | Discharge: 2012-07-21 | DRG: 430 | Disposition: A | Discharge status: Home / Self Care | Payer: MEDICAID | Source: Intra-hospital | Attending: Psychiatry | Admitting: Psychiatry

## 2012-07-18 ENCOUNTER — Encounter (HOSPITAL_COMMUNITY): Payer: Self-pay | Admitting: *Deleted

## 2012-07-18 DIAGNOSIS — F429 Obsessive-compulsive disorder, unspecified: Secondary | ICD-10-CM | POA: Diagnosis present

## 2012-07-18 DIAGNOSIS — F39 Unspecified mood [affective] disorder: Secondary | ICD-10-CM

## 2012-07-18 DIAGNOSIS — F1210 Cannabis abuse, uncomplicated: Secondary | ICD-10-CM | POA: Diagnosis present

## 2012-07-18 DIAGNOSIS — R45851 Suicidal ideations: Secondary | ICD-10-CM

## 2012-07-18 DIAGNOSIS — F17200 Nicotine dependence, unspecified, uncomplicated: Secondary | ICD-10-CM | POA: Diagnosis present

## 2012-07-18 DIAGNOSIS — F319 Bipolar disorder, unspecified: Secondary | ICD-10-CM | POA: Diagnosis present

## 2012-07-18 DIAGNOSIS — R209 Unspecified disturbances of skin sensation: Secondary | ICD-10-CM | POA: Diagnosis present

## 2012-07-18 DIAGNOSIS — F3130 Bipolar disorder, current episode depressed, mild or moderate severity, unspecified: Principal | ICD-10-CM | POA: Diagnosis present

## 2012-07-18 MED ORDER — NAPROXEN 500 MG PO TABS
500.0000 mg | ORAL_TABLET | Freq: Two times a day (BID) | ORAL | Status: DC | PRN
  Administered 2012-07-18: 500 mg via ORAL
  Filled 2012-07-18: qty 1

## 2012-07-18 MED ORDER — LAMOTRIGINE 25 MG PO TABS
25.0000 mg | ORAL_TABLET | Freq: Every day | ORAL | Status: DC
  Administered 2012-07-18 – 2012-07-19 (×2): 25 mg via ORAL
  Filled 2012-07-18 (×5): qty 1

## 2012-07-18 MED ORDER — NICOTINE 21 MG/24HR TD PT24
MEDICATED_PATCH | TRANSDERMAL | Status: AC
  Filled 2012-07-18: qty 1

## 2012-07-18 MED ORDER — TRAZODONE HCL 50 MG PO TABS
50.0000 mg | ORAL_TABLET | Freq: Every evening | ORAL | Status: DC | PRN
  Administered 2012-07-18 – 2012-07-20 (×3): 50 mg via ORAL
  Filled 2012-07-18: qty 6
  Filled 2012-07-18 (×5): qty 1
  Filled 2012-07-18: qty 6
  Filled 2012-07-18 (×3): qty 1

## 2012-07-18 MED ORDER — BUPROPION HCL ER (XL) 150 MG PO TB24
150.0000 mg | ORAL_TABLET | Freq: Every day | ORAL | Status: DC
  Administered 2012-07-19 – 2012-07-21 (×3): 150 mg via ORAL
  Filled 2012-07-18 (×5): qty 1
  Filled 2012-07-18: qty 3

## 2012-07-18 MED ORDER — HYDROXYZINE HCL 25 MG PO TABS: 25.0000 mg | ORAL_TABLET | Freq: Four times a day (QID) | ORAL | Status: DC | PRN

## 2012-07-18 MED ORDER — ALUM & MAG HYDROXIDE-SIMETH 200-200-20 MG/5ML PO SUSP: 30.0000 mL | ORAL | Status: DC | PRN

## 2012-07-18 MED ORDER — NICOTINE 21 MG/24HR TD PT24
21.0000 mg | MEDICATED_PATCH | Freq: Every day | TRANSDERMAL | Status: DC
  Administered 2012-07-18 – 2012-07-20 (×3): 21 mg via TRANSDERMAL
  Filled 2012-07-18 (×7): qty 1

## 2012-07-18 MED ORDER — MAGNESIUM HYDROXIDE 400 MG/5ML PO SUSP: 30.0000 mL | Freq: Every day | ORAL | Status: DC | PRN

## 2012-07-18 MED ORDER — ACETAMINOPHEN 325 MG PO TABS: 650.0000 mg | ORAL_TABLET | Freq: Four times a day (QID) | ORAL | Status: DC | PRN

## 2012-07-18 MED ORDER — CYCLOBENZAPRINE HCL 10 MG PO TABS: 10.0000 mg | ORAL_TABLET | Freq: Three times a day (TID) | ORAL | Status: DC | PRN

## 2012-07-18 MED ORDER — RISPERIDONE 1 MG PO TABS
1.0000 mg | ORAL_TABLET | Freq: Every day | ORAL | Status: DC
  Administered 2012-07-18 – 2012-07-20 (×3): 1 mg via ORAL
  Filled 2012-07-18: qty 1
  Filled 2012-07-18: qty 3
  Filled 2012-07-18 (×4): qty 1

## 2012-07-18 NOTE — Progress Notes (Signed)
Adult Psychoeducational Group Note  Date:  07/18/2012 Time:  1:14 PM  Group Topic/Focus:  Crisis Planning:   The purpose of this group is to help patients create a crisis plan for use upon discharge or in the future, as needed.  Participation Level:  Active  Participation Quality:  Appropriate, Attentive and Sharing  Affect:  Appropriate  Cognitive:  Appropriate  Insight: Appropriate  Engagement in Group:  Engaged  Modes of Intervention:  Discussion  Additional Comments:  Pt was appropriate and sharing while attending group. Pt shared that her triggers are when she is missing her children and her family structure. Pt plans to use community support to help with the triggers that she observes.   Sharyn Lull 07/18/2012, 1:14 PM
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
## 2012-07-18 NOTE — Tx Team (Signed)
Interdisciplinary Treatment Plan Update   Date Reviewed:  07/18/2012  Time Reviewed:  9:37 AM  Progress in Treatment:   Attending groups: Yes Participating in groups: Yes Taking medication as prescribed: Yes  Tolerating medication: Yes Family/Significant other contact made: No, but will ask patient for consent for collateral contact Patient understands diagnosis: Yes  Discussing patient identified problems/goals with staff: Yes Medical problems stabilized or resolved: Yes Denies suicidal/homicidal ideation: Yes Patient has not harmed self or others: Yes  For review of initial/current patient goals, please see plan of care.  Estimated Length of Stay:  3-5 days  Reasons for Continued Hospitalization:  Anxiety Depression Medication stabilization Suicidal ideation  New Problems/Goals identified:    Discharge Plan or Barriers:   Home with outpatient follow up to be scheduled with [facility].  Additional Comments:  Patient reports admitting with SI.  She endorses abusing THC of a gram and a half to three grams daily.  She is followed by [facility] services  Attendees:  Patient:  Sonya Roach 07/18/2012 9:37 AM   Signature: Mervyn Gay, MD 07/18/2012 9:37 AM  Signature: 07/18/2012 9:37 AM  Signature: Harold Barban, RN 07/18/2012 9:37 AM  Signature:Beverly Terrilee Croak, RN 07/18/2012 9:37 AM  Signature:  Neill Loft RN 07/18/2012 9:37 AM  Signature:  Juline Patch, LCSW 07/18/2012 9:37 AM  Signature:  Reyes Ivan, LCSW 07/18/2012 9:37 AM  Signature:  Sharin Grave Coordinator 07/18/2012 9:37 AM  Signature: Fransisca Kaufmann, [facility] 07/18/2012 9:37 AM  Signature:    Signature:    Signature:      Scribe for Treatment Team:   Juline Patch,  07/18/2012 9:37 AM

## 2012-07-18 NOTE — BHH Suicide Risk Assessment (Signed)
Suicide Risk Assessment  Admission Assessment     Nursing information obtained from:  Patient Demographic factors:  Caucasian;Low socioeconomic status Current Mental Status:  Self-harm thoughts Loss Factors:  Loss of significant relationship;Legal issues;Financial problems / change in socioeconomic status Historical Factors:  Prior suicide attempts Risk Reduction Factors:  Responsible for children under 29 years of age;Positive therapeutic relationship  CLINICAL FACTORS:   Depression:   Anhedonia Impulsivity  COGNITIVE FEATURES THAT CONTRIBUTE TO RISK:  Polarized thinking Thought constriction (tunnel vision)    SUICIDE RISK:   Moderate:  Frequent suicidal ideation with limited intensity, and duration, some specificity in terms of plans, no associated intent, good self-control, limited dysphoria/symptomatology, some risk factors present, and identifiable protective factors, including available and accessible social support.  PLAN OF CARE: Supportive approach/coping skills                               Mood stabilization: Lamictal/Rispderdal/Wellbutrin  I certify that inpatient services furnished can reasonably be expected to improve the patient's condition.  Aveya Beal A 07/18/2012, 3:35 PM
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
## 2012-07-18 NOTE — Progress Notes (Signed)
Adult Psychoeducational Group Note  Date:  07/18/2012 Time:  10:54 PM  Group Topic/Focus:  Goals Group:   The focus of this group is to help patients establish daily goals to achieve during treatment and discuss how the patient can incorporate goal setting into their daily lives to aide in recovery.  Participation Level:  Active  Participation Quality:  Appropriate  Affect:  Appropriate  Cognitive:  Appropriate  Insight: Appropriate  Engagement in Group:  Engaged  Modes of Intervention:  Discussion  Additional Comments:  Pt was ok and stated that she is going to put herself first.  Aldona Lento 07/18/2012, 10:54 PM

## 2012-07-18 NOTE — Progress Notes (Signed)
Recreation Therapy Notes  Date: 07.09.2014 Time: 3:00pm Location: [address] Dayroom  Group Topic/Focus: Problem Solving, Communication, Team Work  Participation Level:  Minimal  Participation Quality:  Appropriate  Affect:  Euthymic  Cognitive:  Appropriate  Additional Comments: Activity: Health and safety inspector; Explanation: Patients were given the following supplies: 5 rubber bands, 10 paper clips, 1 paper cup, 3 cardboard tubes, 3 index cards and a length of masking tape. Using the supplies provided patients were asked build a contraption to launch a ping pong ball across the room. Patients worked in teams of 5.   Patient was asked to leave group at approximately 3:05pm by RN, patient returned to group session at approximately 3:20pm. Due to late arrival to group session patient opted to observe peers currently engaged in activity. Patient listened to wrap up discussion, but did not contribute.    Marykay Lex Lautaro Koral, LRT/CTRS  Kashvi Prevette L 07/18/2012 4:07 PM

## 2012-07-18 NOTE — Tx Team (Addendum)
Initial Interdisciplinary Treatment Plan  PATIENT STRENGTHS: (choose at least two) Capable of independent living Communication skills General fund of knowledge  PATIENT STRESSORS: Legal issue Marital or family conflict Medication change or noncompliance Occupational concerns Substance abuse   PROBLEM LIST: Problem List/Patient Goals Date to be addressed Date deferred Reason deferred Estimated date of resolution  SI r/t family conflixt 7--9-14   D/c        medication non-compliance 07-18-12   D/c        Substance abuse THC- desire to stop 07-18-12   D/C                           DISCHARGE CRITERIA:  Ability to meet basic life and health needs Adequate post-discharge living arrangements Improved stabilization in mood, thinking, and/or behavior Motivation to continue treatment in a less acute level of care Reduction of life-threatening or endangering symptoms to within safe limits Safe-care adequate arrangements made Verbal commitment to aftercare and medication compliance  PRELIMINARY DISCHARGE PLAN: Attend aftercare/continuing care group Attend PHP/IOP Return to previous living arrangement  PATIENT/FAMIILY INVOLVEMENT: This treatment plan has been presented to and reviewed with the patient, Sonya Roach, and/or family member,.  The patient and family have been given the opportunity to ask questions and make suggestions.  [name] 07/18/2012, 8:43 AM

## 2012-07-18 NOTE — ED Notes (Signed)
[place] [employer] contacted for transfer to [facility]

## 2012-07-18 NOTE — ED Notes (Signed)
[employer] present to transfer pt
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
## 2012-07-18 NOTE — Progress Notes (Addendum)
Patient taken into search room.  VS taken, consent obtained, fall safety plan explained and patient verbalized understanding.  Skin assessment done, abrasions to chin and chest, superficial cuts to left forearm, scratches to left lower leg, old healing bruises to right leg, and multiple tattoos.  Patient oriented and escorted to the unit by [facility].  Patient belongings ($20.00) secured in locker #30.

## 2012-07-18 NOTE — ED Provider Notes (Signed)
I have personally seen and examined the patient.  I have discussed the plan of care with the resident.  I have reviewed the documentation on PMH/FH/Soc. History.  I have reviewed the documentation of the resident and agree.   I spoke to ACT and completed IVC paperwork and pt will psych eval with likely admission Pt ambulatory in the ED Stable for psych admission   Joya Gaskins, MD 07/18/12 0003

## 2012-07-18 NOTE — [facility] Group Notes (Signed)
[facility] LCSW Group Therapy  Emotional Regulation 1:15 - 2: 30 PM        07/18/2012  11:45 AM   Type of Therapy:  Group Therapy  Participation Level:  Appropriate  Participation Quality:  Appropriate  Affect:  Appropriate  Cognitive:  Attentive Appropriate  Insight:  Engaged  Engagement in Therapy:  Engaged  Modes of Intervention:  Discussion Exploration Problem-Solving Supportive  Summary of Progress/Problems:  Group topic was emotional regulations.  Patient participated in the discussion and was able to identify anger as emotion that needs to be regulated.  Patient was able to identify approprite coping skills of journaling and walking away to calm dow.  Wynn Banker 07/18/2012 11:45 AM
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
## 2012-07-19 MED ORDER — LAMOTRIGINE 25 MG PO TABS
50.0000 mg | ORAL_TABLET | Freq: Every day | ORAL | Status: DC
  Administered 2012-07-20 – 2012-07-21 (×2): 50 mg via ORAL
  Filled 2012-07-19: qty 1
  Filled 2012-07-19 (×3): qty 2
  Filled 2012-07-19: qty 6

## 2012-07-19 NOTE — [facility] Suicide Risk Assessment (Signed)
[facility] INPATIENT:  Family/Significant Other Suicide Prevention Education  Suicide Prevention Education:  Education Completed; Sonya Roach, Boyfriend, [phone]; has been identified by the patient as the family member/significant other with whom the patient will be residing, and identified as the person(s) who will aid the patient in the event of a mental health crisis (suicidal ideations/suicide attempt).  With written consent from the patient, the family member/significant other has been provided the following suicide prevention education, prior to the and/or following the discharge of the patient.  The suicide prevention education provided includes the following:  Suicide risk factors  Suicide prevention and interventions  National Suicide Hotline telephone number  [facility] assessment telephone number  [facility] Emergency Assistance 911  [facility] and/or Residential Mobile Crisis Unit telephone number  Request made of family/significant other to:  Remove weapons (e.g., guns, rifles, knives), all items previously/currently identified as safety concern.  Boyfriend reports patient does not have access to guns.  Remove drugs/medications (over-the-counter, prescriptions, illicit drugs), all items previously/currently identified as a safety concern.  The family member/significant other verbalizes understanding of the suicide prevention education information provided.  The family member/significant other agrees to remove the items of safety concern listed above.  Sonya Roach 07/19/2012, 2:18 PM
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
## 2012-07-19 NOTE — [facility] Group Notes (Signed)
[facility] LCSW Aftercare Discharge Planning Group Note   07/19/2012 10:12 AM  Participation Quality:  Appropriate  Mood/Affect: Appropriate  Depression Rating: 2  Anxiety Rating:  0  Thoughts of Suicide:  Yes  Will you contract for safety?   Yes  Current AVH:  No  Plan for Discharge/Comments:   Patient advised being better today.  She shared the groups have been very beneficial to her.  Transportation Means:   Patient has transportation.   Supports:  Patient has limited support system.   Hilmer Aliberti, Joesph July

## 2012-07-19 NOTE — [facility] Group Notes (Signed)
[facility] LCSW Group Therapy  07/19/2012  1:15 PM   Type of Therapy:  Group Therapy  Participation Level:  Active  Participation Quality:  Appropriate and Attentive  Affect:  Appropriate, Flat  Cognitive:  Alert and Appropriate  Insight:  Developing/Improving and Engaged  Engagement in Therapy:  Developing/Improving and Engaged  Modes of Intervention:  Activity, Clarification, Confrontation, Discussion, Education, Exploration, Limit-setting, Orientation, Problem-solving, Rapport Building, Dance movement psychotherapist, Socialization and Support  Summary of Progress/Problems: Patient was attentive and engaged with speaker from [employer].  Patient expressed interest in their programs and services and received information on their agency.  Patient processed ways they can relate to the speaker.     Reyes Ivan, LCSWA 07/19/2012 1:37 PM

## 2012-07-19 NOTE — Progress Notes (Signed)
D:  Per pt self inventory pt reports sleeping well, appetite improving, energy level normal, ability to pay attention good, rates depression at a 3 out of 10 and hopelessness at a 1 out of 10.  Denies SI/HI/AVH.  No complaints at this time.      A:  Emotional support provided, Encouraged pt to continue with treatment plan and attend all group activities, q15 min checks maintained for safety.  R:  Pt is calm and cooperative, attends and participates in groups and activities, plans to stay on her meds after d/c to better take care of herself when she goes home.

## 2012-07-19 NOTE — Progress Notes (Signed)
Patient ID: Sonya Roach, female   DOB: Feb 05, 1985, 27 y.o.   MRN: [id number] D: Patient denies SI/HI/AVH and pain.  Pt stated she is doing "just fine". Pt presented with depressed mood and flat affect. Pt observered talking and interacting with peers in the dayroom. Pt attended evening karaoke group and was supportive of peers. Pt denies any needs or concerns.  Cooperative with assessment. No acute distressed noted at this time.   A: Met with pt 1:1. Medications administered as prescribed. Writer encouraged pt to discuss feelings. Pt encouraged to come to staff with any question or concerns. 15 minutes checks for safety.  R: Patient remains safe. She is complaint with medications and denies any adverse reaction. Continue current POC.

## 2012-07-19 NOTE — Progress Notes (Signed)
Adult Psychoeducational Group Note  Date:  07/19/2012 Time:  1:52 PM  Group Topic/Focus:  Overcoming Stress:   The focus of this group is to define stress and help patients assess their triggers.  Participation Level:  Active  Participation Quality:  Appropriate  Affect:  Appropriate  Cognitive:  Alert, Appropriate and Oriented  Insight: Appropriate and Good  Engagement in Group:  Engaged  Modes of Intervention:  Discussion, Education, Exploration and Socialization  Additional Comments:  Pt was an active participant in group. She contributed to the conversation and discussed relationships as a stressor in her life.  Reynolds Bowl 07/19/2012, 1:52 PM
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
## 2012-07-20 DIAGNOSIS — F319 Bipolar disorder, unspecified: Secondary | ICD-10-CM | POA: Diagnosis present

## 2012-07-20 MED ORDER — SILVER SULFADIAZINE 1 % EX CREA
1.0000 "application " | TOPICAL_CREAM | Freq: Two times a day (BID) | CUTANEOUS | Status: DC
  Administered 2012-07-20 – 2012-07-21 (×2): 1 via TOPICAL
  Filled 2012-07-20 (×2): qty 85
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
## 2012-07-20 NOTE — [facility] Group Notes (Signed)
[facility] LCSW Group Therapy  Feelings Around Relapse 1:15 -2:30        07/20/2012  11:02 AM   Type of Therapy:  Group Therapy  Participation Level:  Appropriate  Participation Quality:  Appropriate  Affect:  Appropriate  Cognitive:  Attentive Appropriate  Insight:  Engaged  Engagement in Therapy:  Engaged  Modes of Intervention:Discussion, Education, Exploration, Problem-Solving, Rapport Building, Support   Summary of Progress/Problems:  The topic for today was feelings around relapse.    Patient processed feelings toward relapse and was able to relate to peers. Patient identified coping skills that can be used to prevent a relapse.  Patient shared she has to work on identifying triggers and make a plan of how to respond differently.   Wynn Banker 07/20/2012 11:02 AM
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
## 2012-07-20 NOTE — Progress Notes (Signed)
[facility] Adult Case Management Discharge Plan :  Will you be returning to the same living situation after discharge: Yes,  Patient will return to her home. At discharge, do you have transportation home?:Yes,  Patient has transportation home. Do you have the ability to pay for your medications:Yes,  Patient has Medicaid.  Release of information consent forms completed and in the chart;  Patient's signature needed at discharge.  Patient to Follow up at: Follow-up Information   Follow up with [facility] On 07/24/2012. (Please go to [facility]'s walk in clinic on Tuesday, July 24, 2012 or any weekday between 8AM-3PM for medication management)    Contact information:   [address] [place], [place]   [address]  [phone]      [facility] up with [facility] On 07/26/2012. (You are scheduled with [facility] on Thursday, July 26, 2012 at 3:00 PM)    Contact information:   [address] [place], [place]   [address]  [phone]      Patient denies SI/HI:   Patient no longer endorsing SI/HI or other thoughts of self harm.     Safety Planning and Suicide Prevention discussed:  .Reviewed with all patients during discharge planning group  Rihaan Barrack, Joesph July 07/20/2012, 10:45 AM

## 2012-07-20 NOTE — Progress Notes (Signed)
Patient ID: Sonya Roach, female   DOB: 1985-03-28, 27 y.o.   MRN: [id number] D: Patient denies SI/HI/AVH and pain.  Pt stated she is excited about going home and plans to use the coping skills learned here. Pt mood/affect is appropriate to situation. Pt observered talking and interacting with peers in the dayroom. Pt attended evening wrap up group and was engaged in discussion. Pt denies any needs or concerns.  Cooperative with assessment. No acute distressed noted at this time.   A: Met with pt 1:1. Medications administered as prescribed. Writer encouraged pt to discuss feelings. Pt encouraged to come to staff with any question or concerns. 15 minutes checks for safety.  R: Patient remains safe. She is complaint with medications and denies any adverse reaction. Continue current POC.

## 2012-07-20 NOTE — Tx Team (Signed)
Interdisciplinary Treatment Plan Update   Date Reviewed:  07/20/2012  Time Reviewed:  10:39 AM  Progress in Treatment:   Attending groups: Yes Participating in groups: Yes Taking medication as prescribed: Yes  Tolerating medication: Yes Family/Significant other contact made:Yes, contact made with husband. Patient understands diagnosis: Yes  Discussing patient identified problems/goals with staff: Yes Medical problems stabilized or resolved: Yes Denies suicidal/homicidal ideation: Yes Patient has not harmed self or others: Yes  For review of initial/current patient goals, please see plan of care.  Estimated Length of Stay: 1 days  Reasons for Continued Hospitalization:  Medication stabilization  New Problems/Goals identified:    Discharge Plan or Barriers:   Home with outpatient follow up to be scheduled with [facility].  Additional Comments:   N/A  Attendees:  Patient:   07/20/2012 10:39 AM   Signature:  07/20/2012 10:39 AM  Signature: 07/20/2012 10:39 AM  Signature:  Lamount Cranker, RN 07/20/2012 10:39 AM  Signature: 07/20/2012 10:39 AM  Signature:   07/20/2012 10:39 AM  Signature:  Juline Patch, LCSW 07/20/2012 10:39 AM  Signature:  Reyes Ivan, LCSW 07/20/2012 10:39 AM  Signature:  Sharin Grave Coordinator 07/20/2012 10:39 AM  Signature: Fransisca Kaufmann, [facility] 07/20/2012 10:39 AM  Signature:    Signature:    Signature:      Scribe for Treatment Team:   Juline Patch,  07/20/2012 10:39 AM

## 2012-07-20 NOTE — [facility] Group Notes (Signed)
[facility] LCSW Aftercare Discharge Planning Group Note   07/20/2012 10:37 AM  Participation Quality:  Appropriate  Mood/Affect: Appropriate  Depression Rating: 0  Anxiety Rating:  0  Thoughts of Suicide:  No  Will you contract for safety?  N/A  Current AVH:  No  Plan for Discharge/Comments:   Patient advised being much better today and hopes to discharge home.  Transportation Means:   Patient has transportation.  Supports:  Patient has limited support system.   Sonya Roach, Joesph July

## 2012-07-20 NOTE — Plan of Care (Signed)
Problem: Alteration in mood & ability to function due to Goal: LTG-Patient demonstrates decreased signs of withdrawal Patient is not endorsing any sign/symptoms of withdrawal at this time.  Horace Porteous Swain Acree, LCSW 07/18/2012  Outcome: Completed/Met Date Met:  07/20/12 Patient is not endorsing any sign/symptoms of withdrawal.  She has attended groups and actively engaged in discussions.    Horace Porteous Hadasah Brugger, LCSW 07/20/2012

## 2012-07-20 NOTE — Plan of Care (Signed)
Problem: Ineffective individual coping Goal: STG: Patient will participate in after care plan Patient is attending group and discussing issues. Outpatient services to be scheduled prior to discharge.  Horace Porteous Malina Geers, LCSW 07/18/2012  Outcome: Completed/Met Date Met:  07/20/12 Patient has attended groups and easily engaged in discussions.  Outpatient follow up is scheduled. Octavius Shin, LCSW 07/20/2012

## 2012-07-20 NOTE — Progress Notes (Signed)
Adult Psychoeducational Group Note  Date:  07/20/2012 Time:  12:29 PM  Group Topic/Focus:  Relapse Prevention Planning:   The focus of this group is to define relapse and discuss the need for planning to combat relapse.  Participation Level:  Active  Participation Quality:  Appropriate and Attentive  Affect:  Depressed, Flat and Irritable  Cognitive:  Alert and Appropriate  Insight: Good  Engagement in Group:  Engaged  Modes of Intervention:  Activity, Discussion, Socialization and Support  Additional Comments:  Pt came to group and shared and participated. Pt did become agitated with some of the other members of the group for talking out of turn. The purpose of this group was for the patient to identify triggers, early warning signs and old behaviors that lead to relapse. Pt made a personalized relapse prevention plan that included different coping skills to prevent reverting to unhealthy behaviors.  Cathlean Cower 07/20/2012, 12:29 PM

## 2012-07-21 MED ORDER — TRAZODONE HCL 50 MG PO TABS: 50.0000 mg | ORAL_TABLET | Freq: Every evening | ORAL | Status: DC | PRN

## 2012-07-21 MED ORDER — SILVER SULFADIAZINE 1 % EX CREA: 1.0000 "application " | TOPICAL_CREAM | Freq: Two times a day (BID) | CUTANEOUS | Status: DC

## 2012-07-21 MED ORDER — BUPROPION HCL ER (XL) 150 MG PO TB24: 150.0000 mg | ORAL_TABLET | Freq: Every day | ORAL | Status: DC

## 2012-07-21 MED ORDER — LAMOTRIGINE 25 MG PO TABS: 50.0000 mg | ORAL_TABLET | Freq: Every day | ORAL | Status: DC

## 2012-07-21 MED ORDER — RISPERIDONE 1 MG PO TABS: 1.0000 mg | ORAL_TABLET | Freq: Every day | ORAL | Status: DC

## 2012-07-21 NOTE — Progress Notes (Signed)
The focus of this group is to help patients establish daily goals to achieve during treatment and discuss how the patient can incorporate goal setting into their daily lives to aide in recovery.  The theme for today is healthy coping skills. Pt stated that she has long-term goals after discharge and that "certain things are going to change". Pt would not elaborate on what was going to change or her long-term goals.

## 2012-07-21 NOTE — Progress Notes (Signed)
Adult Psychoeducational Group Note  Date:  07/21/2012 Time:  3:05 PM  Group Topic/Focus:  Identifying Needs:   The focus of this group is to help patients identify their personal needs that have been historically problematic and identify healthy behaviors to address their needs.  Participation Level:  Active  Participation Quality:  Appropriate, Sharing and Supportive  Affect:  Appropriate  Cognitive:  Appropriate  Insight: Appropriate  Engagement in Group:  Engaged, Improving and Supportive  Modes of Intervention:  Discussion, Education, Role-play, Socialization and Support  Additional Comments:  Patients will increase their understanding of their needs by listing at least 2 personal needs that they see as an issue that brought them into the hospital. Patient will identify at least one benefit and obstacle in continuing to use the same unhealthy behaviors Pt. will identify one healthy behavior to begin using within the first 24 hours of group. Pt attended group. Pt stated that her energy level was an 8.5 out of 10. 1 being the lowest, 10 being the highest. Pt stated that two of her needs are to be visible and being listened to. Pt stated that two unhealthy behaviors are obsessive thoughts and negative self talk. Pt stated that she would work on not letting the words or actions of others effecting how she feels.   Caswell Corwin 07/21/2012, 3:05 PM
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
## 2012-07-21 NOTE — Progress Notes (Signed)
Nrsg DC MD completed DC SRA, as well as DC MD order. Pt denies audit, vis, tactile haluc. Pt completed AM self inventory and on it she wrote she denies SI within the previous 24 hrs, she rated her depression and hopelessness "1/1"

## 2012-07-21 NOTE — [facility] Suicide Risk Assessment (Signed)
Suicide Risk Assessment  Discharge Assessment     Demographic Factors:  Caucasian and Living alone  Mental Status Per Nursing Assessment::   On Admission:  Self-harm thoughts  Current Mental Status by Physician: Patient seen today.  She is anxious but cooperative.  She maintained good eye contact.  Her speech is fast but clear and coherent.  Her thought processes logical and goal-directed.  There are no flight of ideas or any loose physician.  She denies any active or passive suicidal thoughts or homicidal thoughts.  There were no tremors or shakes.  There were no paranoia or delusion obsession present at this time.  She denies any auditory or visual hallucination.  Her fund of knowledge is adequate.  She's alert and oriented x3.  Her insight judgment and impulse control is okay.  Loss Factors: Loss of significant relationship and Financial problems/change in socioeconomic status  Historical Factors: Prior suicide attempts and Impulsivity  Risk Reduction Factors:   Employed, Living with another person, especially a relative, Positive social support, Positive therapeutic relationship and Positive coping skills or problem solving skills  Continued Clinical Symptoms:  Unstable or Poor Therapeutic Relationship Previous Psychiatric Diagnoses and Treatments  Cognitive Features That Contribute To Risk:  Closed-mindedness Loss of executive function Polarized thinking    Suicide Risk:  Minimal: No identifiable suicidal ideation.  Patients presenting with no risk factors but with morbid ruminations; may be classified as minimal risk based on the severity of the depressive symptoms  Discharge Diagnoses:   AXIS I:  Bipolar, Depressed AXIS II:  Deferred AXIS III:   Past Medical History  Diagnosis Date  . OCD (obsessive compulsive disorder)   . Bipolar 1 disorder    AXIS IV:  other psychosocial or environmental problems and problems related to social environment AXIS V:  61-70 mild  symptoms  Plan Of Care/Follow-up recommendations:  Activity:  As tolerated Diet:  Unchanged from the past  Is patient on multiple antipsychotic therapies at discharge:  No   Has Patient had three or more failed trials of antipsychotic monotherapy by history:  No  Recommended Plan for Multiple Antipsychotic Therapies: Taper to monotherapy as described:  Patient will [facility].  Paidyn Mcferran T. 07/21/2012, 10:53 AM

## 2012-07-24 NOTE — Progress Notes (Signed)
Patient Discharge Instructions:  After Visit Summary (AVS):   Faxed to:  07/24/12 Discharge Summary Note:   Faxed to:  07/24/12 Psychiatric Admission Assessment Note:   Faxed to:  07/24/12 Suicide Risk Assessment - Discharge Assessment:   Faxed to:  07/24/12 Faxed/Sent to the Next Level Care provider:  07/24/12 Faxed to [facility] @ [phone] Faxed to [facility] @ [phone]  Jerelene Redden, 07/24/2012, 3:39 PM

## 2012-08-21 NOTE — Progress Notes (Signed)
Agree with assessment and plan Sheretta Grumbine A. Gen Clagg, M.D. 

## 2012-08-29 ENCOUNTER — Emergency Department (HOSPITAL_COMMUNITY)
Admission: EM | Admit: 2012-08-29 | Discharge: 2012-08-29 | Discharge status: Against Medical Advice | Payer: Medicaid Other | Attending: Emergency Medicine | Admitting: Emergency Medicine

## 2012-08-29 DIAGNOSIS — Z046 Encounter for general psychiatric examination, requested by authority: Secondary | ICD-10-CM | POA: Insufficient documentation
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
## 2012-08-29 NOTE — ED Notes (Signed)
Pt states that she is not suicidal or homicidal and wants to leave; pt states that she has an appointment on Tues and may be back after she gets her daughter settled; pt walked out of the ER; pt declines to sign out AMA

## 2012-08-30 ENCOUNTER — Emergency Department (HOSPITAL_COMMUNITY): Payer: Medicaid Other

## 2012-08-30 ENCOUNTER — Encounter (HOSPITAL_COMMUNITY): Payer: Self-pay | Admitting: Emergency Medicine

## 2012-08-30 ENCOUNTER — Emergency Department (HOSPITAL_COMMUNITY)
Admission: EM | Admit: 2012-08-30 | Discharge: 2012-08-30 | Disposition: A | Discharge status: Home / Self Care | Payer: Medicaid Other | Attending: Emergency Medicine | Admitting: Emergency Medicine

## 2012-08-30 DIAGNOSIS — Y929 Unspecified place or not applicable: Secondary | ICD-10-CM | POA: Insufficient documentation

## 2012-08-30 DIAGNOSIS — F319 Bipolar disorder, unspecified: Secondary | ICD-10-CM | POA: Insufficient documentation

## 2012-08-30 DIAGNOSIS — Z8659 Personal history of other mental and behavioral disorders: Secondary | ICD-10-CM | POA: Insufficient documentation

## 2012-08-30 DIAGNOSIS — F17200 Nicotine dependence, unspecified, uncomplicated: Secondary | ICD-10-CM | POA: Insufficient documentation

## 2012-08-30 DIAGNOSIS — IMO0002 Reserved for concepts with insufficient information to code with codable children: Secondary | ICD-10-CM | POA: Insufficient documentation

## 2012-08-30 DIAGNOSIS — Y9389 Activity, other specified: Secondary | ICD-10-CM | POA: Insufficient documentation

## 2012-08-30 DIAGNOSIS — S99921A Unspecified injury of right foot, initial encounter: Secondary | ICD-10-CM

## 2012-08-30 DIAGNOSIS — S8990XA Unspecified injury of unspecified lower leg, initial encounter: Secondary | ICD-10-CM | POA: Insufficient documentation

## 2012-08-30 HISTORY — DX: Major depressive disorder, single episode, unspecified: F32.9

## 2012-08-30 HISTORY — DX: Depression, unspecified: F32.A

## 2012-08-30 MED ORDER — NAPROXEN 500 MG PO TABS: 500.0000 mg | ORAL_TABLET | Freq: Two times a day (BID) | ORAL | Status: DC

## 2012-08-30 NOTE — ED Provider Notes (Signed)
Medical screening examination/treatment/procedure(s) were performed by non-physician practitioner and as supervising physician I was immediately available for consultation/collaboration.   Shelda Jakes, MD 08/30/12 [phone]

## 2012-08-30 NOTE — ED Provider Notes (Signed)
CSN: [id number]     Arrival date & time 08/30/12  1335 History     First MD Initiated Contact with Patient 08/30/12 1350     Chief Complaint  Patient presents with  . Foot Injury   (Consider location/radiation/quality/duration/timing/severity/associated sxs/prior Treatment) HPI Comments: Patient reports that she has been having pain to the dorsal aspect of the right foot since kicking a door earlier this morning.  Pain has been constant and gradually worsening.  She reports that she has been ambulatory since the injury, but has increased pain with ambulation.  She has not taken anything for pain prior to arrival.  She has noticed some mild swelling of the dorsal aspect of the foot, but denies any bruising or laceration.  Patient denies any numbness or tingling.    The history is provided by the patient.    Past Medical History  Diagnosis Date  . OCD (obsessive compulsive disorder)   . Bipolar 1 disorder   . Depression    Past Surgical History  Procedure Laterality Date  . Tubal ligation    . Dilitation and curretage     No family history on file. History  Substance Use Topics  . Smoking status: Current Every Day Smoker -- 1.00 packs/day    Types: Cigarettes  . Smokeless tobacco: Not on file  . Alcohol Use: No   OB History   Grav Para Term Preterm Abortions TAB SAB Ect Mult Living                 Review of Systems  Musculoskeletal:       Right foot pain  All other systems reviewed and are negative.    Allergies  Review of patient's allergies indicates no known allergies.  Home Medications   Current Outpatient Rx  Name  Route  Sig  Dispense  Refill  . buPROPion (WELLBUTRIN XL) 150 MG 24 hr tablet   Oral   Take 1 tablet (150 mg total) by mouth daily. For depression.   30 tablet   0   . lamoTRIgine (LAMICTAL) 25 MG tablet   Oral   Take 2 tablets (50 mg total) by mouth daily. For mood stability.   60 tablet   0   . risperiDONE (RISPERDAL) 1 MG tablet  Oral   Take 1 tablet (1 mg total) by mouth at bedtime. For mood stability   30 tablet   0   . silver sulfADIAZINE (SILVADENE) 1 % cream   Topical   Apply 1 application topically 2 (two) times daily.   50 g   0   . traZODone (DESYREL) 50 MG tablet   Oral   Take 1 tablet (50 mg total) by mouth at bedtime and may repeat dose one time if needed. For sleep.   60 tablet   0    BP 92/66  Pulse 74  Temp(Src) 99.2 F (37.3 C) (Oral)  Resp 18  SpO2 99%  LMP 08/30/2012 Physical Exam  Nursing note and vitals reviewed. Constitutional: She appears well-developed and well-nourished.  HENT:  Head: Normocephalic and atraumatic.  Cardiovascular: Normal rate, regular rhythm and normal heart sounds.   Pulses:      Dorsalis pedis pulses are 2+ on the right side, and 2+ on the left side.  Pulmonary/Chest: Effort normal and breath sounds normal.  Musculoskeletal: Normal range of motion.       Right ankle: She exhibits normal range of motion and no deformity. No tenderness.  Feet:  Patient able to wiggle toes without difficulty.  Neurological: She is alert. No sensory deficit.  Skin: Skin is warm and dry.  Psychiatric: She has a normal mood and affect.    ED Course   Procedures (including critical care time)  Labs Reviewed - No data to display Dg Foot Complete Right  08/30/2012   *RADIOLOGY REPORT*  Clinical Data: Foot injury.  Kicked door.  RIGHT FOOT COMPLETE - 3+ VIEW  Comparison: None  Findings: Negative for fracture.  Normal alignment and no arthropathy.  IMPRESSION: Negative   Original Report Authenticated By: Janeece Riggers, M.D.   No diagnosis found.  MDM  Patient presenting with foot pain after kicking a door.  Xray negative.  Patient neurovascularly intact.  Foot wrapped with ACE wrap and patient given crutches for comfort.    Pascal Lux [place], PA-C 08/30/12 [phone]

## 2012-08-30 NOTE — ED Notes (Signed)
Pt stats she kicked something with her right foot this morning and now she has pain

## 2013-02-07 ENCOUNTER — Encounter: Payer: Self-pay | Admitting: Obstetrics

## 2013-02-07 ENCOUNTER — Ambulatory Visit (INDEPENDENT_AMBULATORY_CARE_PROVIDER_SITE_OTHER): Payer: Medicaid Other | Admitting: Obstetrics

## 2013-02-07 VITALS — BP 115/74 | HR 90 | Temp 98.9°F | Ht <= 58 in | Wt 150.0 lb

## 2013-02-07 DIAGNOSIS — N920 Excessive and frequent menstruation with regular cycle: Secondary | ICD-10-CM

## 2013-02-07 DIAGNOSIS — N643 Galactorrhea not associated with childbirth: Secondary | ICD-10-CM

## 2013-02-07 MED ORDER — IBUPROFEN 800 MG PO TABS: 800.0000 mg | ORAL_TABLET | Freq: Three times a day (TID) | ORAL | Status: DC | PRN

## 2013-02-07 NOTE — Progress Notes (Signed)
Subjective:     Sonya Roach is a 28 y.o. female here for a Problem visit.  Current complaints: Patient states she is still producing milk three years after giving birth. Patient states she has also had a tubal ligation.   Personal health questionnaire reviewed: yes.   Gynecologic History Patient's last menstrual period was 01/09/2013. Contraception: tubal ligation  Obstetric History OB History  Gravida Para Term Preterm AB SAB TAB Ectopic Multiple Living  3 2 2  1 1    2     # Outcome Date GA Lbr Len/2nd Weight Sex Delivery Anes PTL Lv  3 TRM 10/10/09 6125w0d  8 lb (3.629 kg) F SVD EPI  Y  2 SAB 2008 3348w0d       N  1 TRM 07/17/02 6073w0d  8 lb 3 oz (3.714 kg) F SVD EPI  Y       The following portions of the patient's history were reviewed and updated as appropriate: allergies, current medications, past family history, past medical history, past social history, past surgical history and problem list.  Review of Systems Pertinent items are noted in HPI.    Objective:    Breasts: normal appearance, no masses or tenderness, positive findings: nipple discharge bilaterally and no masses or tenderness.    Assessment:    Galactorrhea.   Plan:    Education reviewed: Management of nipple discharge.. Follow up in: 2 weeks. TSH and Prolactin ordered.

## 2013-02-08 LAB — TSH: TSH: 1.385 u[IU]/mL (ref 0.350–4.500)

## 2013-02-08 LAB — PROLACTIN: PROLACTIN: 46.6 ng/mL

## 2013-02-11 ENCOUNTER — Encounter: Payer: Self-pay | Admitting: Obstetrics

## 2013-02-11 DIAGNOSIS — N643 Galactorrhea not associated with childbirth: Secondary | ICD-10-CM | POA: Insufficient documentation

## 2013-02-13 ENCOUNTER — Other Ambulatory Visit: Payer: Self-pay | Admitting: Obstetrics

## 2013-02-13 DIAGNOSIS — E229 Hyperfunction of pituitary gland, unspecified: Secondary | ICD-10-CM

## 2013-02-13 DIAGNOSIS — R7989 Other specified abnormal findings of blood chemistry: Secondary | ICD-10-CM

## 2013-02-13 DIAGNOSIS — N643 Galactorrhea not associated with childbirth: Secondary | ICD-10-CM

## 2013-02-15 ENCOUNTER — Other Ambulatory Visit: Payer: Self-pay | Admitting: Obstetrics

## 2013-02-15 DIAGNOSIS — R7989 Other specified abnormal findings of blood chemistry: Secondary | ICD-10-CM

## 2013-02-15 DIAGNOSIS — N643 Galactorrhea not associated with childbirth: Secondary | ICD-10-CM

## 2013-02-15 DIAGNOSIS — E229 Hyperfunction of pituitary gland, unspecified: Secondary | ICD-10-CM

## 2013-02-28 ENCOUNTER — Ambulatory Visit (INDEPENDENT_AMBULATORY_CARE_PROVIDER_SITE_OTHER): Payer: Medicaid Other | Admitting: Internal Medicine

## 2013-02-28 ENCOUNTER — Encounter: Payer: Self-pay | Admitting: Internal Medicine

## 2013-02-28 VITALS — BP 102/64 | HR 96 | Temp 98.5°F | Resp 12 | Ht <= 58 in | Wt 150.0 lb

## 2013-02-28 DIAGNOSIS — N643 Galactorrhea not associated with childbirth: Secondary | ICD-10-CM

## 2013-02-28 DIAGNOSIS — E221 Hyperprolactinemia: Secondary | ICD-10-CM

## 2013-02-28 DIAGNOSIS — E349 Endocrine disorder, unspecified: Secondary | ICD-10-CM

## 2013-02-28 DIAGNOSIS — R7989 Other specified abnormal findings of blood chemistry: Secondary | ICD-10-CM

## 2013-02-28 DIAGNOSIS — E229 Hyperfunction of pituitary gland, unspecified: Secondary | ICD-10-CM

## 2013-02-28 LAB — PROLACTIN: PROLACTIN: 7.8 ng/mL

## 2013-02-28 NOTE — Patient Instructions (Signed)
Please let me know what you discussed with your psychiatrist. Please stop at the lab. Please return in 3 months.

## 2013-02-28 NOTE — Progress Notes (Signed)
Patient ID: Sonya Roach, female   DOB: 10-Dec-1985, 28 y.o.   MRN: [id number]   HPI  Sonya Roach is a 28 y.o.-year-old female, referred by her PCP, Dr. Coral Ceo [facility] office), for evaluation for hyperprolactinemia with galactorrhea.  I reviewed pt's pertinent tests: Component     Latest Ref Rng 02/07/2013  Prolactin      46.6  TSH     0.350 - 4.500 uIU/mL 1.385   Pt tells me she started to have galactorrhea few months ago >> expresses milk if pressure applied to breasts >> will stain her clothes. This is bilateral.   She had 3 pregnancies, and has 2 children (+ 1 miscarriage). Last pregnancy in 3.5 years ago. She could breastfeed her children except last child >> only R breast was functional.  Her menstrual cycles were always regular (32 day cycle), but more painful after her tubal ligation (12/2009). They are heavy >> anemia >> takes iron.  Last sexual contact 5 mo ago.   She also c/o HAs >> associated with blurry vision, photo- and phonophobia (in the last 1.5 years).   She describes blurry vision and HA when reading more.   Her weight fluctuates b/w 125-150 lbs.   She c/o acne, new since 3 mo ago.   No FH of thyroid/pituitary disorders.   She started Lamictal and Risperdal 2 years ago. She will have an appt with her new psychiatrist (at [facility]) on 03/06/2013. She tells me she was off Risperdal in the past and did not see any difference and is wondering if she can be taken off.  ROS: Constitutional: see HPI, + both weight gain/loss - lately more gain, no fatigue, no subjective hyperthermia/hypothermia Eyes: + blurry vision, no xerophthalmia ENT: no sore throat, no nodules palpated in throat, no dysphagia/odynophagia, no hoarseness Cardiovascular: no CP/SOB/palpitations/leg swelling Respiratory: no cough/SOB Gastrointestinal: no N/V/D/C Musculoskeletal: no muscle/joint aches Skin: no rashes Neurological: no  tremors/numbness/tingling/dizziness Psychiatric: + depression/+ anxiety  Past Medical History  Diagnosis Date  . OCD (obsessive compulsive disorder)   . Bipolar 1 disorder   . Depression    Past Surgical History  Procedure Laterality Date  . Tubal ligation    . Dilitation and curretage     History   Social History  . Marital Status: Single    Spouse Name: N/A    Number of Children: 2: 54 and 3 y/o   Occupational History  . 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 Smoking status: Former Smoker -- 1.00 packs/day    Types: Cigarettes    Quit date: 02/21/2013  . Smokeless tobacco: Never Used  . Alcohol Use: No  . Drug Use: Yes    Special: Marijuana  . Sexual Activity: Not Currently    Birth Control/ Protection: Surgical   Current Outpatient Prescriptions on File Prior to Visit  Medication Sig Dispense Refill  . buPROPion (WELLBUTRIN XL) 150 MG 24 hr tablet Take 1 tablet (150 mg total) by mouth daily. For depression.  30 tablet  0  . ibuprofen (ADVIL,MOTRIN) 800 MG tablet Take 1 tablet (800 mg total) by mouth every 8 (eight) hours as needed.  30 tablet  5  . lamoTRIgine (LAMICTAL) 25 MG tablet Take 2 tablets (50 mg total) by mouth daily. For mood stability.  60 tablet  0  . naproxen (NAPROSYN) 500 MG tablet Take 1 tablet (500 mg total) by mouth 2 (two) times daily.  30 tablet  0  .  risperiDONE (RISPERDAL) 1 MG tablet Take 1 tablet (1 mg total) by mouth at bedtime. For mood stability  30 tablet  0  . traZODone (DESYREL) 50 MG tablet Take 1 tablet (50 mg total) by mouth at bedtime and may repeat dose one time if needed. For sleep.  60 tablet  0   No current facility-administered medications on file prior to visit.   No Known Allergies No family history on file.  PE: BP 102/64  Pulse 96  Temp(Src) 98.5 F (36.9 C) (Oral)  Resp 12  Ht 4\' 10"  (1.473 m)  Wt 150 lb (68.04 kg)  BMI 31.36 kg/m2  SpO2 96%  LMP 02/10/2013 Wt Readings from Last 3 Encounters:   02/28/13 150 lb (68.04 kg)  02/07/13 150 lb (68.04 kg)  07/19/12 133 lb (60.328 kg)   Constitutional: overweight, in NAD Eyes: PERRLA, EOMI, no exophthalmos ENT: moist mucous membranes, no thyromegaly, no cervical lymphadenopathy Cardiovascular: RRR, No MRG Respiratory: CTA B Gastrointestinal: abdomen soft, NT, ND, BS+ Musculoskeletal: no deformities, strength intact in all 4 Skin: moist, warm, + acne spots on face Neurological: no tremor with outstretched hands, DTR normal in all 4; VF intact by confrontation except blurry vision when she covers R eye   ASSESSMENT: 1. Hyperprolactinemia - with galactorrhea  2. HA, blurry vision - when eyes are tired  PLAN:  1. Patient with few mo-h/o galactorrhea, with recently discovered hyperprolactinemia. The galactorrhea is bilateral, and usually appears with pressure. She does not stain her clothes unless pressure is applied to her breasts.  - The most likely cause for hyperproteinemia in this patient is the use of Risperdal, which was started approximately 3 years ago. We discussed about the fact that this would increase the prolactin, without the presence of a pituitary tumor, and possibly to high levels, up to 300-400s. Since Risperdal is one of the most common drugs to cause hyperprolactinemia, an MRI of the pituitary is not indicated, per guidelines, unless the prolactin is >100. The patient appeared relieved, and she remembered that immediately after starting Risperdal, her breast size increased by 1 cup.  - the patient will meet with her psychiatrist in 5 days and they will discuss about the need to continue Risperdal and the possibility to change to another medicine. I advised the patient that,  If she needs to stay on Risperdal, we can start Cabergoline 0.25 mg twice a week, which will decrease the PRL level and the galactorrhea  If she can come off Risperdal, most likely no treatment is needed, we just need to recheck a prolactin level  to make sure it normalizes - I advised her to let me know either through my chart or by phone what the decision was at the appointment with her psychiatrist - I will see the patient back in 3 months and would recheck a prolactin at that time, regardless of the treatment plan direction (see above).  - we will check another PRL level today to confirm HPRL  2. Headaches and blurry vision - Patient had increased blurry vision when she covered her right eye, and her symptoms are indicative of declining vision in the possibly need of glasses. I advised her to see her ophthalmologist.  Office Visit on 02/28/2013  Component Date Value Ref Range Status  . Prolactin 02/28/2013 7.8   Final   Comment:      Reference Ranges:  Female:                       2.1 -  17.1 ng/ml                                           Female:   Pregnant          9.7 - 208.5 ng/mL                                                     Non Pregnant      2.8 -  29.2 ng/mL                                                     Post Menopausal   1.8 -  20.3 ng/mL                                               PRL now normal! I would still try to decrease or stop Risperdal if possible, but no treatment is now needed is she remains on it since the PRL is normal. Her breast discharge should slow down.

## 2013-03-01 DIAGNOSIS — R7989 Other specified abnormal findings of blood chemistry: Secondary | ICD-10-CM | POA: Insufficient documentation

## 2013-03-01 DIAGNOSIS — E229 Hyperfunction of pituitary gland, unspecified: Secondary | ICD-10-CM

## 2013-05-30 ENCOUNTER — Ambulatory Visit: Payer: Self-pay | Admitting: Internal Medicine

## 2013-07-04 ENCOUNTER — Telehealth: Payer: Self-pay | Admitting: *Deleted

## 2013-07-04 NOTE — Telephone Encounter (Signed)
Patient contact the office for an unknown reason requesting a call back. Attempted to contact the patient with no answer and unable to leave a voicemail.

## 2013-07-10 ENCOUNTER — Ambulatory Visit: Payer: Medicaid Other | Admitting: Neurology

## 2013-07-15 NOTE — Telephone Encounter (Signed)
No return call from patient.

## 2013-07-24 ENCOUNTER — Ambulatory Visit: Payer: Medicaid Other | Admitting: Obstetrics

## 2013-08-06 ENCOUNTER — Emergency Department (HOSPITAL_COMMUNITY)
Admission: EM | Admit: 2013-08-06 | Discharge: 2013-08-06 | Disposition: A | Discharge status: Home / Self Care | Payer: Medicaid Other | Attending: Emergency Medicine | Admitting: Emergency Medicine

## 2013-08-06 ENCOUNTER — Encounter (HOSPITAL_COMMUNITY): Payer: Self-pay | Admitting: Emergency Medicine

## 2013-08-06 DIAGNOSIS — Z79899 Other long term (current) drug therapy: Secondary | ICD-10-CM | POA: Diagnosis not present

## 2013-08-06 DIAGNOSIS — Z8659 Personal history of other mental and behavioral disorders: Secondary | ICD-10-CM | POA: Diagnosis not present

## 2013-08-06 DIAGNOSIS — L02219 Cutaneous abscess of trunk, unspecified: Secondary | ICD-10-CM | POA: Insufficient documentation

## 2013-08-06 DIAGNOSIS — L03319 Cellulitis of trunk, unspecified: Secondary | ICD-10-CM | POA: Diagnosis not present

## 2013-08-06 DIAGNOSIS — Z87891 Personal history of nicotine dependence: Secondary | ICD-10-CM | POA: Insufficient documentation

## 2013-08-06 DIAGNOSIS — L02214 Cutaneous abscess of groin: Secondary | ICD-10-CM

## 2013-08-06 MED ORDER — SULFAMETHOXAZOLE-TRIMETHOPRIM 800-160 MG PO TABS: 1.0000 | ORAL_TABLET | Freq: Two times a day (BID) | ORAL | Status: DC

## 2013-08-06 MED ORDER — HYDROCODONE-ACETAMINOPHEN 5-325 MG PO TABS: 1.0000 | ORAL_TABLET | ORAL | Status: DC | PRN

## 2013-08-06 NOTE — ED Provider Notes (Signed)
CSN: [id number]     Arrival date & time 08/06/13  1648 History   None    This chart was scribed for non-physician practitioner, Dierdre Forth PA-C working with Ward Givens, MD by Arlan Organ, ED Scribe. This patient was seen in room TR10C/TR10C and the patient's care was started at 5:43 PM.   Chief Complaint  Patient presents with  . Abscess   The history is provided by the patient and medical records. No language interpreter was used.    HPI Comments: Sonya Roach is a 28 y.o. female with a PMHx of OCD and Bipolar 1 Disorder who presents to the Emergency Department complaining of a large abscess to the L groin x 2 months that has been waxing and waning, but progressively worsened in the last week. She reports noticeable drainage from the area initially, but now only pain remains and the area is no longer draining.  She admits to shaving with a straight razor. She has tried OTC Ibuprofen without any improvement for symptoms. She has also tried warm compresses and warm soaks to the area with no relief. At this time she denies any fever or chills. No known allergies to medications. She has no pertinent past medical history. No other concerns this visit.    Past Medical History  Diagnosis Date  . OCD (obsessive compulsive disorder)   . Bipolar 1 disorder   . Depression    Past Surgical History  Procedure Laterality Date  . Tubal ligation    . Dilitation and curretage     History reviewed. No pertinent family history. History  Substance Use Topics  . Smoking status: Former Smoker -- 1.00 packs/day    Types: Cigarettes    Quit date: 02/21/2013  . Smokeless tobacco: Never Used  . Alcohol Use: No   OB History   Grav Para Term Preterm Abortions TAB SAB Ect Mult Living   3 2 2  1  1   2      Review of Systems  Constitutional: Negative for fever and chills.  Gastrointestinal: Negative for nausea and vomiting.  Skin: Positive for rash and wound (Abscess to L groin).   Allergic/Immunologic: Negative for immunocompromised state.  Hematological: Does not bruise/bleed easily.  Psychiatric/Behavioral: The patient is not nervous/anxious.       Allergies  Review of patient's allergies indicates no known allergies.  Home Medications   Prior to Admission medications   Medication Sig Start Date End Date Taking? Authorizing Provider  acetaminophen (TYLENOL) 500 MG tablet Take 500 mg by mouth every 6 (six) hours as needed for mild pain.   Yes Historical Provider, MD  ibuprofen (ADVIL,MOTRIN) 800 MG tablet Take 1 tablet (800 mg total) by mouth every 8 (eight) hours as needed. 02/07/13  Yes Brock Bad, MD  HYDROcodone-acetaminophen (NORCO/VICODIN) 5-325 MG per tablet Take 1 tablet by mouth every 4 (four) hours as needed for moderate pain or severe pain. 08/06/13   Glorene Leitzke, PA-C  sulfamethoxazole-trimethoprim (SEPTRA DS) 800-160 MG per tablet Take 1 tablet by mouth every 12 (twelve) hours. 08/06/13   Giovonni Poirier, PA-C   Triage Vitals: BP 126/71  Pulse 88  Temp(Src) 98.4 F (36.9 C) (Oral)  Resp 18  SpO2 98%  LMP 07/15/2013   Physical Exam  Nursing note and vitals reviewed. Constitutional: She is oriented to person, place, and time. She appears well-developed and well-nourished. No distress.  HENT:  Head: Normocephalic and atraumatic.  Eyes: Conjunctivae are normal. No scleral icterus.  Neck:  Normal range of motion.  Cardiovascular: Normal rate, regular rhythm, normal heart sounds and intact distal pulses.   No murmur heard. Pulmonary/Chest: Effort normal and breath sounds normal.  Abdominal: Soft. She exhibits no distension. There is no tenderness. Hernia confirmed negative in the right inguinal area and confirmed negative in the left inguinal area.  Genitourinary: Vagina normal.    There is no rash, tenderness or lesion on the right labia. There is tenderness (just lateral to the labia) on the left labia. There is no rash,  lesion or injury on the left labia.  3cm x 1.5cm area of induration located just lateral to the left labia majora without overlying erythema or evidence of cellulitis - tender to palpation.    Lymphadenopathy:    She has no cervical adenopathy.       Right: No inguinal adenopathy present.       Left: No inguinal adenopathy present.  Neurological: She is alert and oriented to person, place, and time.  Skin: Skin is warm and dry. She is not diaphoretic. No erythema.  Psychiatric: She has a normal mood and affect.    ED Course  Procedures (including critical care time)  DIAGNOSTIC STUDIES: Oxygen Saturation is 98% on RA, Normal by my interpretation.    COORDINATION OF CARE: 5:44 PM- Will perform I&D procedure. Discussed treatment plan with pt at bedside and pt agreed to plan.      EMERGENCY DEPARTMENT US SOFT TISSUE INTERPRETATION "Study: Limited Ultrasound of the noted body part in comments below"  INDICATIONS: Pain Multiple views of the body part are obtained with a multi-frequency linear probe  PERFORMED BY:  Myself  IMAGES ARCHIVED?: Yes  SIDE:Left  BODY PART:Other soft tisse (comment in note)  FINDINGS: Abcess present  LIMITATIONS:  Body Habitus  INTERPRETATION:  Abcess present  COMMENT:  Abscess present without evidence of cellulitis     INCISION AND DRAINAGE PROCEDURE NOTE: Patient identification was confirmed and verbal consent was obtained. This procedure was performed by Sonya ClientHannah Alahna Dunne PA-C  at 5:46 PM. Site: L groin Sterile procedures observed Anesthetic used (type and amt): 4 CC of 2% lidocaine without epinephrine Drainage: Small and cystic in nature Complexity: Complex Packing used: None Site anesthetized, incision made over site, wound drained and explored loculations, rinsed with copious amounts of normal saline, wound packed with sterile gauze, covered with dry, sterile dressing.  Pt tolerated procedure well without complications.   Instructions for care discussed verbally and pt provided with additional written instructions for homecare and f/u.    Labs Review Labs Reviewed - No data to display  Imaging Review No results found.   EKG Interpretation None      MDM   Final diagnoses:  Abscess of left groin   Sonya Roach presents with left groin abscess, confirmed with US.  Patient with skin abscess amenable to incision and drainage.  Abscess was not large enough to warrant packing or drain,  wound recheck in 3 days by OB/GYN. Encouraged home warm soaks and flushing.  Mild signs of cellulitis is surrounding skin.  Will d/c to home with bactrim due to the nature of the cleanliness of the area.    I have personally reviewed patient's vitals, nursing note and any pertinent labs or imaging.  I performed an undressed physical exam.    At this time, it has been determined that no acute conditions requiring further emergency intervention. The patient/guardian have been advised of the diagnosis and plan. I reviewed all labs and  imaging including any potential incidental findings. We have discussed signs and symptoms that warrant return to the ED, such as fever, spreading erythema.  Patient/guardian has voiced understanding and agreed to follow-up with the PCP or specialist in 3 days.  Vital signs are stable at discharge.   BP 126/71  Pulse 88  Temp(Src) 98.4 F (36.9 C) (Oral)  Resp 18  SpO2 98%  LMP 07/15/2013      .  I personally performed the services described in this documentation, which was scribed in my presence. The recorded information has been reviewed and is accurate.    Sonya Client Janeene Sand, PA-C 08/06/13 1827

## 2013-08-06 NOTE — ED Provider Notes (Signed)
Medical screening examination/treatment/procedure(s) were performed by non-physician practitioner and as supervising physician I was immediately available for consultation/collaboration.   EKG Interpretation None      Jaylianna Tatlock, MD, FACEP   Armina Galloway L Ersilia Brawley, MD 08/06/13 2338 

## 2013-08-06 NOTE — ED Notes (Signed)
Declined wheelchair

## 2013-08-06 NOTE — ED Notes (Signed)
Pt reports having large abscess/ingrown hair to left groin area. Denies fever. No acute distress noted at triage.

## 2013-08-06 NOTE — Discharge Instructions (Signed)
1. Medications: bactrim, usual home medications 2. Treatment: rest, drink plenty of fluids, warm water soaks 3. Follow Up: Please followup with your primary doctor for discussion of your diagnoses and further evaluation after today's visit; if you do not have a primary care doctor use the resource guide provided to find one;     Abscess An abscess is an infected area that contains a collection of pus and debris.It can occur in almost any part of the body. An abscess is also known as a furuncle or boil. CAUSES  An abscess occurs when tissue gets infected. This can occur from blockage of oil or sweat glands, infection of hair follicles, or a minor injury to the skin. As the body tries to fight the infection, pus collects in the area and creates pressure under the skin. This pressure causes pain. People with weakened immune systems have difficulty fighting infections and get certain abscesses more often.  SYMPTOMS Usually an abscess develops on the skin and becomes a painful mass that is red, warm, and tender. If the abscess forms under the skin, you may feel a moveable soft area under the skin. Some abscesses break open (rupture) on their own, but most will continue to get worse without care. The infection can spread deeper into the body and eventually into the bloodstream, causing you to feel ill.  DIAGNOSIS  Your caregiver will take your medical history and perform a physical exam. A sample of fluid may also be taken from the abscess to determine what is causing your infection. TREATMENT  Your caregiver may prescribe antibiotic medicines to fight the infection. However, taking antibiotics alone usually does not cure an abscess. Your caregiver may need to make a small cut (incision) in the abscess to drain the pus. In some cases, gauze is packed into the abscess to reduce pain and to continue draining the area. HOME CARE INSTRUCTIONS   Only take over-the-counter or prescription medicines for pain,  discomfort, or fever as directed by your caregiver.  If you were prescribed antibiotics, take them as directed. Finish them even if you start to feel better.  If gauze is used, follow your caregiver's directions for changing the gauze.  To avoid spreading the infection:  Keep your draining abscess covered with a bandage.  Wash your hands well.  Do not share personal care items, towels, or whirlpools with others.  Avoid skin contact with others.  Keep your skin and clothes clean around the abscess.  Keep all follow-up appointments as directed by your caregiver. SEEK MEDICAL CARE IF:   You have increased pain, swelling, redness, fluid drainage, or bleeding.  You have muscle aches, chills, or a general ill feeling.  You have a fever. MAKE SURE YOU:   Understand these instructions.  Will watch your condition.  Will get help right away if you are not doing well or get worse. Document Released: 10/06/2004 Document Revised: 06/28/2011 Document Reviewed: 03/11/2011 [facility] Patient Information 2015 [place] [place]. This information is not intended to replace advice given to you by your health care provider. Make sure you discuss any questions you have with your health care provider.

## 2013-08-14 ENCOUNTER — Ambulatory Visit: Payer: Medicaid Other | Admitting: Obstetrics

## 2013-08-22 ENCOUNTER — Ambulatory Visit (INDEPENDENT_AMBULATORY_CARE_PROVIDER_SITE_OTHER): Payer: Medicaid Other | Admitting: Obstetrics

## 2013-08-22 ENCOUNTER — Other Ambulatory Visit: Payer: Self-pay | Admitting: Obstetrics

## 2013-08-22 VITALS — BP 129/83 | HR 71 | Temp 98.9°F | Wt 140.0 lb

## 2013-08-22 DIAGNOSIS — Z0000 Encounter for general adult medical examination without abnormal findings: Secondary | ICD-10-CM

## 2013-08-22 DIAGNOSIS — Z01419 Encounter for gynecological examination (general) (routine) without abnormal findings: Secondary | ICD-10-CM

## 2013-08-23 ENCOUNTER — Encounter: Payer: Self-pay | Admitting: Obstetrics

## 2013-08-23 LAB — PAP IG AND HPV HIGH-RISK: HPV DNA HIGH RISK: NOT DETECTED

## 2013-08-23 LAB — WET PREP BY MOLECULAR PROBE
Candida species: NEGATIVE
GARDNERELLA VAGINALIS: POSITIVE — AB
Trichomonas vaginosis: NEGATIVE

## 2013-08-23 LAB — GC/CHLAMYDIA PROBE AMP
CT PROBE, AMP APTIMA: NEGATIVE
GC PROBE AMP APTIMA: NEGATIVE

## 2013-08-23 NOTE — Progress Notes (Signed)
Patient ID: Sonya Roach, female   DOB: [birth year], 28 y.o.   MRN: [id number]  Chief Complaint  Patient presents with  . Follow-up    HPI Sonya Roach is a 28 y.o. female.  S/P I&D of labial cyst.  Presents for F/U and pap smear.  HPI  Past Medical History  Diagnosis Date  . OCD (obsessive compulsive disorder)   . Bipolar 1 disorder   . Depression     Past Surgical History  Procedure Laterality Date  . Tubal ligation    . Dilitation and curretage      History reviewed. No pertinent family history.  Social History History  Substance Use Topics  . Smoking status: Former Smoker -- 1.00 packs/day    Types: Cigarettes    Quit date: 02/21/2013  . Smokeless tobacco: Never Used  . Alcohol Use: No    No Known Allergies  Current Outpatient Prescriptions  Medication Sig Dispense Refill  . ibuprofen (ADVIL,MOTRIN) 800 MG tablet Take 1 tablet (800 mg total) by mouth every 8 (eight) hours as needed.  30 tablet  5  . acetaminophen (TYLENOL) 500 MG tablet Take 500 mg by mouth every 6 (six) hours as needed for mild pain.      Marland Kitchen. HYDROcodone-acetaminophen (NORCO/VICODIN) 5-325 MG per tablet Take 1 tablet by mouth every 4 (four) hours as needed for moderate pain or severe pain.  6 tablet  0  . sulfamethoxazole-trimethoprim (SEPTRA DS) 800-160 MG per tablet Take 1 tablet by mouth every 12 (twelve) hours.  20 tablet  0   No current facility-administered medications for this visit.    Review of Systems Review of Systems Constitutional: negative for fatigue and weight loss Respiratory: negative for cough and wheezing Cardiovascular: negative for chest pain, fatigue and palpitations Gastrointestinal: negative for abdominal pain and change in bowel habits Genitourinary:negative Integument/breast: negative for nipple discharge Musculoskeletal:negative for myalgias Neurological: negative for gait problems and tremors Behavioral/Psych: negative for abusive relationship,  depression Endocrine: negative for temperature intolerance     Blood pressure 129/83, pulse 71, temperature 98.9 F (37.2 C), weight 140 lb (63.504 kg), last menstrual period 08/12/2013.  Physical Exam Physical Exam General:   alert  Skin:   no rash or abnormalities  Lungs:   clear to auscultation bilaterally  Heart:   regular rate and rhythm, S1, S2 normal, no murmur, click, rub or gallop  Breasts:   normal without suspicious masses, skin or nipple changes or axillary nodes  Abdomen:  normal findings: no organomegaly, soft, non-tender and no hernia  Pelvis:  External genitalia: normal general appearance Urinary system: urethral meatus normal and bladder without fullness, nontender Vaginal: normal without tenderness, induration or masses Cervix: normal appearance Adnexa: normal bimanual exam Uterus: anteverted and non-tender, normal size      Data Reviewed Labs  Assessment    Routine Gyn exam.  Doing well.     Plan   F/U 1 year.  Orders Placed This Encounter  Procedures  . WET PREP BY MOLECULAR PROBE  . GC/Chlamydia Probe Amp   No orders of the defined types were placed in this encounter.       Sonya Roach A 08/23/2013, 6:41 AM

## 2013-09-04 ENCOUNTER — Other Ambulatory Visit: Payer: Self-pay | Admitting: *Deleted

## 2013-09-04 DIAGNOSIS — B9689 Other specified bacterial agents as the cause of diseases classified elsewhere: Secondary | ICD-10-CM

## 2013-09-04 DIAGNOSIS — N760 Acute vaginitis: Principal | ICD-10-CM

## 2013-09-04 MED ORDER — METRONIDAZOLE 500 MG PO TABS: 500.0000 mg | ORAL_TABLET | Freq: Two times a day (BID) | ORAL | Status: DC

## 2013-09-04 NOTE — Progress Notes (Signed)
Pt made aware of lab results and Metronidazole 500mg sent to pharmacy. 

## 2013-10-15 ENCOUNTER — Ambulatory Visit: Payer: Medicaid Other | Admitting: Obstetrics

## 2013-10-15 ENCOUNTER — Emergency Department (HOSPITAL_COMMUNITY)
Admission: EM | Admit: 2013-10-15 | Discharge: 2013-10-16 | Discharge status: Against Medical Advice | Payer: Medicaid Other | Attending: Emergency Medicine | Admitting: Emergency Medicine

## 2013-10-15 ENCOUNTER — Encounter (HOSPITAL_COMMUNITY): Payer: Self-pay | Admitting: Emergency Medicine

## 2013-10-15 DIAGNOSIS — N760 Acute vaginitis: Secondary | ICD-10-CM | POA: Insufficient documentation

## 2013-10-15 DIAGNOSIS — N764 Abscess of vulva: Secondary | ICD-10-CM | POA: Diagnosis not present

## 2013-10-15 NOTE — ED Notes (Signed)
Pt states she has a possible abscess to vagina. Pt states she shaved the other day and then it kept getting worse.

## 2013-10-16 ENCOUNTER — Encounter (HOSPITAL_COMMUNITY): Payer: Self-pay | Admitting: Emergency Medicine

## 2013-10-16 ENCOUNTER — Emergency Department (HOSPITAL_COMMUNITY)
Admission: EM | Admit: 2013-10-16 | Discharge: 2013-10-16 | Disposition: A | Discharge status: Home / Self Care | Payer: Medicaid Other | Attending: Emergency Medicine | Admitting: Emergency Medicine

## 2013-10-16 DIAGNOSIS — Z8659 Personal history of other mental and behavioral disorders: Secondary | ICD-10-CM | POA: Diagnosis not present

## 2013-10-16 DIAGNOSIS — Z87891 Personal history of nicotine dependence: Secondary | ICD-10-CM | POA: Diagnosis not present

## 2013-10-16 DIAGNOSIS — N907 Vulvar cyst: Secondary | ICD-10-CM | POA: Diagnosis present

## 2013-10-16 DIAGNOSIS — N764 Abscess of vulva: Secondary | ICD-10-CM

## 2013-10-16 MED ORDER — LIDOCAINE HCL (PF) 1 % IJ SOLN
5.0000 mL | Freq: Once | INTRAMUSCULAR | Status: AC
  Administered 2013-10-16: 5 mL
  Filled 2013-10-16: qty 5

## 2013-10-16 MED ORDER — HYDROCODONE-ACETAMINOPHEN 5-325 MG PO TABS
1.0000 | ORAL_TABLET | Freq: Once | ORAL | Status: AC
  Administered 2013-10-16: 1 via ORAL
  Filled 2013-10-16: qty 1

## 2013-10-16 MED ORDER — SULFAMETHOXAZOLE-TMP DS 800-160 MG PO TABS: 1.0000 | ORAL_TABLET | Freq: Two times a day (BID) | ORAL | Status: DC

## 2013-10-16 MED ORDER — HYDROCODONE-ACETAMINOPHEN 5-325 MG PO TABS: 1.0000 | ORAL_TABLET | ORAL | Status: DC | PRN

## 2013-10-16 MED ORDER — SULFAMETHOXAZOLE-TMP DS 800-160 MG PO TABS
1.0000 | ORAL_TABLET | Freq: Once | ORAL | Status: AC
  Administered 2013-10-16: 1 via ORAL
  Filled 2013-10-16: qty 1

## 2013-10-16 NOTE — Discharge Instructions (Signed)
Abscess Care After An abscess (also called a boil or furuncle) is an infected area that contains a collection of pus. Signs and symptoms of an abscess include pain, tenderness, redness, or hardness, or you may feel a moveable soft area under your skin. An abscess can occur anywhere in the body. The infection may spread to surrounding tissues causing cellulitis. A cut (incision) by the surgeon was made over your abscess and the pus was drained out. Gauze may have been packed into the space to provide a drain that will allow the cavity to heal from the inside outwards. The boil may be painful for 5 to 7 days. Most people with a boil do not have high fevers. Your abscess, if seen early, may not have localized, and may not have been lanced. If not, another appointment may be required for this if it does not get better on its own or with medications. HOME CARE INSTRUCTIONS   Only take over-the-counter or prescription medicines for pain, discomfort, or fever as directed by your caregiver.  When you bathe, soak and then remove gauze or iodoform packs at least daily or as directed by your caregiver. You may then wash the wound gently with mild soapy water. Repack with gauze or do as your caregiver directs. SEEK IMMEDIATE MEDICAL CARE IF:   You develop increased pain, swelling, redness, drainage, or bleeding in the wound site.  You develop signs of generalized infection including muscle aches, chills, fever, or a general ill feeling.  An oral temperature above 102 F (38.9 C) develops, not controlled by medication. See your caregiver for a recheck if you develop any of the symptoms described above. If medications (antibiotics) were prescribed, take them as directed. Document Released: 07/15/2004 Document Revised: 03/21/2011 Document Reviewed: 03/12/2007 [facility] Patient Information 2015 [place], [place]. This information is not intended to replace advice given to you by your health care provider. Make sure  you discuss any questions you have with your health care provider. Your abscess.  Has been drained.  Apply warm compresses or sitz baths several times a day for the next 2-3, days.  Antibiotics, and pain control.  He continued to have persistent problems in his care.  Please make an appointment with OB/GYN for further evaluation

## 2013-10-16 NOTE — ED Provider Notes (Signed)
Medical screening examination/treatment/procedure(s) were performed by non-physician practitioner and as supervising physician I was immediately available for consultation/collaboration.   EKG Interpretation None        Tomasita CrumbleAdeleke Ruwayda Curet, MD 10/16/13 [phone]

## 2013-10-16 NOTE — ED Provider Notes (Signed)
CSN: [id number]     Arrival date & time 10/16/13  0132 History   First MD Initiated Contact with Patient 10/16/13 0430     Chief Complaint  Patient presents with  . Cyst     (Consider location/radiation/quality/duration/timing/severity/associated sxs/prior Treatment) HPI Comments: Percent with recurrent left-sided labial cyst.  She, states, that she had similar episode, probably 2 months ago, with drainage.  She's had symptoms for 5 days.  Denies any trauma, fever, dysuria, nausea, vomiting, diarrhea, vaginal discharge  The history is provided by the patient.    Past Medical History  Diagnosis Date  . OCD (obsessive compulsive disorder)   . Bipolar 1 disorder   . Depression    Past Surgical History  Procedure Laterality Date  . Tubal ligation    . Dilitation and curretage     No family history on file. History  Substance Use Topics  . Smoking status: Former Smoker -- 1.00 packs/day    Types: Cigarettes    Quit date: 02/21/2013  . Smokeless tobacco: Never Used  . Alcohol Use: No   OB History   Grav Para Term Preterm Abortions TAB SAB Ect Mult Living   3 2 2  1  1   2      Review of Systems  Constitutional: Negative for fever.  Gastrointestinal: Negative for nausea, vomiting and diarrhea.  Genitourinary: Negative for dysuria, vaginal discharge, genital sores and vaginal pain.  All other systems reviewed and are negative.     Allergies  Review of patient's allergies indicates no known allergies.  Home Medications   Prior to Admission medications   Medication Sig Start Date End Date Taking? Authorizing Provider  HYDROcodone-acetaminophen (NORCO/VICODIN) 5-325 MG per tablet Take 1 tablet by mouth every 4 (four) hours as needed for moderate pain. 10/16/13   Arman FilterGail K Vaishali Baise, NP  sulfamethoxazole-trimethoprim (BACTRIM DS) 800-160 MG per tablet Take 1 tablet by mouth 2 (two) times daily. 10/16/13   Arman FilterGail K Parrie Rasco, NP   BP 106/63  Pulse 93  Temp(Src) 98.2 F (36.8 C)  (Oral)  Resp 20  Wt 140 lb (63.504 kg)  SpO2 97%  LMP 10/09/2013 Physical Exam  Nursing note and vitals reviewed. Constitutional: She appears well-developed and well-nourished.  HENT:  Head: Normocephalic.  Eyes: Pupils are equal, round, and reactive to light.  Neck: Normal range of motion.  Cardiovascular: Normal rate.   Pulmonary/Chest: Effort normal.  Genitourinary:     Musculoskeletal: Normal range of motion.  Neurological: She is alert.  Skin: Skin is warm.    ED Course  INCISION AND DRAINAGE Date/Time: 10/16/2013 4:59 AM Performed by: Arman FilterSCHULZ, Shirin Echeverry K Authorized by: Arman FilterSCHULZ, Richerd Grime K Consent: Verbal consent obtained. written consent not obtained. Consent given by: patient Patient understanding: patient states understanding of the procedure being performed Patient identity confirmed: verbally with patient Type: abscess Body area: anogenital Location details: vulva Anesthesia: local infiltration Local anesthetic: lidocaine 1% without epinephrine Anesthetic total: 3 ml Patient sedated: no Scalpel size: 11 Needle gauge: 22 Incision type: single straight Complexity: simple Drainage amount: copious Wound treatment: wound left open Patient tolerance: Patient tolerated the procedure well with no immediate complications.   (including critical care time) Labs Review Labs Reviewed - No data to display  Imaging Review No results found.   EKG Interpretation None      MDM   Final diagnoses:  Abscess of labia majora         Arman FilterGail K Shalla Bulluck, NP 10/16/13 0503

## 2013-10-16 NOTE — ED Notes (Signed)
Pt. reports left vaginal "cyst " onset 5 days ago with no drainage , denies fever or chills, no dysuria or vaginal discharge .

## 2013-10-22 ENCOUNTER — Encounter: Payer: Self-pay | Admitting: Obstetrics & Gynecology

## 2013-11-11 ENCOUNTER — Encounter (HOSPITAL_COMMUNITY): Payer: Self-pay | Admitting: Emergency Medicine

## 2013-12-19 ENCOUNTER — Encounter: Payer: Self-pay | Admitting: Obstetrics

## 2013-12-19 ENCOUNTER — Ambulatory Visit (INDEPENDENT_AMBULATORY_CARE_PROVIDER_SITE_OTHER): Payer: Medicaid Other | Admitting: Obstetrics

## 2013-12-19 VITALS — BP 116/79 | HR 63 | Temp 97.3°F | Ht <= 58 in | Wt 133.0 lb

## 2013-12-19 DIAGNOSIS — B9689 Other specified bacterial agents as the cause of diseases classified elsewhere: Secondary | ICD-10-CM | POA: Insufficient documentation

## 2013-12-19 DIAGNOSIS — R52 Pain, unspecified: Secondary | ICD-10-CM

## 2013-12-19 DIAGNOSIS — L089 Local infection of the skin and subcutaneous tissue, unspecified: Secondary | ICD-10-CM

## 2013-12-19 DIAGNOSIS — N764 Abscess of vulva: Secondary | ICD-10-CM | POA: Insufficient documentation

## 2013-12-19 DIAGNOSIS — A499 Bacterial infection, unspecified: Secondary | ICD-10-CM

## 2013-12-19 MED ORDER — HYDROCODONE-ACETAMINOPHEN 10-300 MG PO TABS: 1.0000 | ORAL_TABLET | Freq: Four times a day (QID) | ORAL | Status: DC | PRN

## 2013-12-19 MED ORDER — DOXYCYCLINE HYCLATE 100 MG PO CAPS: 100.0000 mg | ORAL_CAPSULE | Freq: Two times a day (BID) | ORAL | Status: DC

## 2013-12-19 MED ORDER — METRONIDAZOLE 500 MG PO TABS: 500.0000 mg | ORAL_TABLET | Freq: Two times a day (BID) | ORAL | Status: DC

## 2013-12-19 NOTE — Addendum Note (Signed)
Addended by: Odessa FlemingBOHNE, Jenasis Straley M on: 12/19/2013 03:40 PM   Modules accepted: Orders

## 2013-12-19 NOTE — Progress Notes (Signed)
Patient ID: Sonya GrammesJennifer L Rudd, female   DOB: October 30, 1985, 28 y.o.   MRN: [id number]  Chief Complaint  Patient presents with  . Boils on labia    HPI Sonya GrammesJennifer L Weinand is a 28 y.o. female.  Boils on labia.  HPI  Past Medical History  Diagnosis Date  . OCD (obsessive compulsive disorder)   . Bipolar 1 disorder   . Depression     Past Surgical History  Procedure Laterality Date  . Tubal ligation    . Dilitation and curretage      History reviewed. No pertinent family history.  Social History History  Substance Use Topics  . Smoking status: Current Every Day Smoker -- 1.00 packs/day    Types: Cigarettes  . Smokeless tobacco: Never Used  . Alcohol Use: No    No Known Allergies  Current Outpatient Prescriptions  Medication Sig Dispense Refill  . doxycycline (VIBRAMYCIN) 100 MG capsule Take 1 capsule (100 mg total) by mouth 2 (two) times daily. 28 capsule 2  . Hydrocodone-Acetaminophen 10-300 MG TABS Take 1 tablet by mouth every 6 (six) hours as needed. 40 each 0  . metroNIDAZOLE (FLAGYL) 500 MG tablet Take 1 tablet (500 mg total) by mouth 2 (two) times daily. 28 tablet 2   No current facility-administered medications for this visit.    Review of Systems Review of Systems Constitutional: negative for fatigue and weight loss Respiratory: negative for cough and wheezing Cardiovascular: negative for chest pain, fatigue and palpitations Gastrointestinal: negative for abdominal pain and change in bowel habits Genitourinary: positive for boils on labia, recurrent, painful Integument/breast: negative for nipple discharge Musculoskeletal:negative for myalgias Neurological: negative for gait problems and tremors Behavioral/Psych: negative for abusive relationship, depression Endocrine: negative for temperature intolerance     Blood pressure 116/79, pulse 63, temperature 97.3 F (36.3 C), height 4\' 10"  (1.473 m), weight 133 lb (60.328 kg), last menstrual period  12/11/2013.  Physical Exam Physical Exam                   Pelvis:  External genitalia: left vulva swollen, indurated and tender in lower aspect, Urinary system: urethral meatus normal and bladder without fullness, Vaginal: normal without tenderness, induration or masses Cervix: normal appearance Adnexa: normal bimanual exam Uterus: anteverted and non-tender, normal size      Data Reviewed labs  Assessment    Left vulva carbuncle     Plan  Doxy / Flagyl Rx for 2 weeks.  Warm sitz baths with Epsom Salts  Vicodin HP Rx   No orders of the defined types were placed in this encounter.   Meds ordered this encounter  Medications  . doxycycline (VIBRAMYCIN) 100 MG capsule    Sig: Take 1 capsule (100 mg total) by mouth 2 (two) times daily.    Dispense:  28 capsule    Refill:  2  . metroNIDAZOLE (FLAGYL) 500 MG tablet    Sig: Take 1 tablet (500 mg total) by mouth 2 (two) times daily.    Dispense:  28 tablet    Refill:  2  . Hydrocodone-Acetaminophen 10-300 MG TABS    Sig: Take 1 tablet by mouth every 6 (six) hours as needed.    Dispense:  40 each    Refill:  0        Makenzi Bannister A 12/19/2013, 1:53 PM

## 2013-12-20 MED ORDER — KETOROLAC TROMETHAMINE 60 MG/2ML IM SOLN
60.0000 mg | Freq: Once | INTRAMUSCULAR | Status: AC
  Administered 2013-12-19: 60 mg via INTRAMUSCULAR

## 2013-12-20 NOTE — Addendum Note (Signed)
Addended by: Marya LandryFOSTER, Gailyn Crook D on: 12/20/2013 09:14 AM   Modules accepted: Orders

## 2013-12-24 ENCOUNTER — Other Ambulatory Visit: Payer: Self-pay | Admitting: Obstetrics

## 2013-12-24 ENCOUNTER — Encounter: Payer: Self-pay | Admitting: Family Medicine

## 2013-12-24 LAB — SURESWAB, VAGINOSIS/VAGINITIS PLUS
Atopobium vaginae: 7.6 Log (cells/mL)
BV CATEGORY: UNDETERMINED — AB
C. ALBICANS, DNA: NOT DETECTED
C. GLABRATA, DNA: NOT DETECTED
C. PARAPSILOSIS, DNA: NOT DETECTED
C. TRACHOMATIS RNA, TMA: NOT DETECTED
C. TROPICALIS, DNA: NOT DETECTED
Gardnerella vaginalis: >8 Log (cells/mL)
LACTOBACILLUS SPECIES: 5.6 Log (cells/mL)
MEGASPHAERA SPECIES: 7.7 Log (cells/mL)
N. GONORRHOEAE RNA, TMA: NOT DETECTED
T. vaginalis RNA, QL TMA: NOT DETECTED

## 2014-01-16 ENCOUNTER — Ambulatory Visit: Payer: Medicaid Other | Admitting: Obstetrics

## 2014-04-21 ENCOUNTER — Telehealth: Payer: Self-pay | Admitting: *Deleted

## 2014-04-21 NOTE — Telephone Encounter (Signed)
Faxed refill request from pharmacy- Ibuprofen 800 mg 1 tablet every 8 hours prn #30 OK to RF?

## 2014-04-22 ENCOUNTER — Other Ambulatory Visit: Payer: Self-pay | Admitting: Obstetrics

## 2014-04-22 DIAGNOSIS — N946 Dysmenorrhea, unspecified: Secondary | ICD-10-CM

## 2014-04-22 MED ORDER — IBUPROFEN 800 MG PO TABS: 800.0000 mg | ORAL_TABLET | Freq: Three times a day (TID) | ORAL | Status: DC | PRN

## 2014-04-22 NOTE — Telephone Encounter (Signed)
OK for 5 refills.  Done.

## 2022-08-03 ENCOUNTER — Telehealth: Payer: Self-pay | Admitting: Neurosurgery

## 2022-08-03 NOTE — Telephone Encounter (Signed)
Patient is seeking a 2nd opinion with this office since she has moved closer to [place]. All her records and most recent cervical MRI are in [facility] everywhere. Patient states that Dr.Zimel wanted to prescribe her pills and she knows she needs surgery.  Please read the plan on office visit 07/18/2022. Who should she schedule with Dr.Smith or Dr.Yarbrough?
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
## 2022-08-05 ENCOUNTER — Inpatient Hospital Stay
Admission: RE | Admit: 2022-08-05 | Discharge: 2022-08-05 | Disposition: A | Discharge status: Home / Self Care | Payer: Self-pay | Source: Ambulatory Visit | Attending: Neurosurgery | Admitting: Neurosurgery

## 2022-08-05 ENCOUNTER — Other Ambulatory Visit: Payer: Self-pay

## 2022-08-05 DIAGNOSIS — Z049 Encounter for examination and observation for unspecified reason: Secondary | ICD-10-CM

## 2022-08-05 NOTE — Telephone Encounter (Signed)
Images have been loaded to her chart. Will forward to Dr Myer Haff for review.

## 2022-08-08 NOTE — Telephone Encounter (Signed)
She confirmed appt for 8/15.

## 2022-08-17 NOTE — Progress Notes (Deleted)
Referring Physician:  No referring provider defined for this encounter.  Primary Physician:  Patient, No Pcp Per  History of Present Illness: 08/17/2022 [name] is here today with a chief complaint of ***  For second opinion   severe neck pain and numbness in her distal forearms and hands bilaterally,right side is worse than the left    Duration: ***8 years + Location: *** Quality: ***numbness in both of her hands  Severity: ***  Precipitating: aggravated by *** Modifying factors: made better by *** Weakness: in both hands  Timing: ***intermittent Bowel/Bladder Dysfunction: none  Conservative measures:  Physical therapy: *** ? Multimodal medical therapy including regular antiinflammatories: *** gabapentin  Injections: ***  denies ? epidural steroid injections   Past Surgery: ***? Denies   Sonya Roach has ***no symptoms of cervical myelopathy.  The symptoms are causing a significant impact on the patient's life.   I have utilized the care everywhere function in epic to review the outside records available from external health systems.  Review of Systems:  A 10 point review of systems is negative, except for the pertinent positives and negatives detailed in the HPI.  Past Medical History: Past Medical History:  Diagnosis Date   Bipolar 1 disorder (HCC)    Depression    OCD (obsessive compulsive disorder)     Past Surgical History: Past Surgical History:  Procedure Laterality Date   dilitation and curretage     TUBAL LIGATION      Allergies: Allergies as of 08/25/2022   (No Known Allergies)    Medications:  Current Outpatient Medications:    doxycycline (VIBRAMYCIN) 100 MG capsule, Take 1 capsule (100 mg total) by mouth 2 (two) times daily., Disp: 28 capsule, Rfl: 2   Hydrocodone-Acetaminophen 10-300 MG TABS, Take 1 tablet by mouth every 6 (six) hours as needed., Disp: 40 each, Rfl: 0   ibuprofen (ADVIL,MOTRIN) 800 MG tablet,  Take 1 tablet (800 mg total) by mouth every 8 (eight) hours as needed., Disp: 30 tablet, Rfl: 5   metroNIDAZOLE (FLAGYL) 500 MG tablet, Take 1 tablet (500 mg total) by mouth 2 (two) times daily., Disp: 28 tablet, Rfl: 2  Social History: Social History   Tobacco Use   Smoking status: Every Day    Current packs/day: 1.00    Types: Cigarettes   Smokeless tobacco: Never  Substance Use Topics   Alcohol use: No    Alcohol/week: 0.0 standard drinks of alcohol   Drug use: No    Family Medical History: No family history on file.  Physical Examination: There were no vitals filed for this visit.  General: Patient is in no apparent distress. Attention to examination is appropriate.  Neck:   Supple.  Full range of motion.  Respiratory: Patient is breathing without any difficulty.   NEUROLOGICAL:     Awake, alert, oriented to person, place, and time.  Speech is clear and fluent.   Cranial Nerves: Pupils equal round and reactive to light.  Facial tone is symmetric.  Facial sensation is symmetric. Shoulder shrug is symmetric. Tongue protrusion is midline.  There is no pronator drift.  Strength: Side Biceps Triceps Deltoid Interossei Grip Wrist Ext. Wrist Flex.  R 5 5 5 5 5 5 5   L 5 5 5 5 5 5 5    Side Iliopsoas Quads Hamstring PF DF EHL  R 5 5 5 5 5 5   L 5 5 5 5 5 5    Reflexes are ***2+ and symmetric at  the biceps, triceps, brachioradialis, patella and achilles.   Hoffman's is absent.   Bilateral upper and lower extremity sensation is intact to light touch.    No evidence of dysmetria noted.  Gait is normal.     Medical Decision Making  Imaging: ***  I have personally reviewed the images and agree with the above interpretation.  Assessment and Plan: Sonya Roach is a pleasant 37 y.o. female with ***    Thank you for involving me in the care of this patient.      Chester K. Myer Haff MD, [facility]

## 2022-08-25 ENCOUNTER — Ambulatory Visit: Payer: 59 | Admitting: Neurosurgery

## 2023-07-27 ENCOUNTER — Inpatient Hospital Stay: Attending: Genetic Counselor | Admitting: Genetic Counselor

## 2023-07-27 ENCOUNTER — Inpatient Hospital Stay

## 2023-10-03 ENCOUNTER — Other Ambulatory Visit: Payer: Self-pay | Admitting: Licensed Clinical Social Worker

## 2023-10-03 ENCOUNTER — Inpatient Hospital Stay (HOSPITAL_BASED_OUTPATIENT_CLINIC_OR_DEPARTMENT_OTHER): Admitting: Licensed Clinical Social Worker

## 2023-10-03 ENCOUNTER — Encounter: Payer: Self-pay | Admitting: Licensed Clinical Social Worker

## 2023-10-03 DIAGNOSIS — Z800 Family history of malignant neoplasm of digestive organs: Secondary | ICD-10-CM | POA: Insufficient documentation

## 2023-10-03 DIAGNOSIS — Z8041 Family history of malignant neoplasm of ovary: Secondary | ICD-10-CM | POA: Insufficient documentation

## 2023-10-03 DIAGNOSIS — Z1379 Encounter for other screening for genetic and chromosomal anomalies: Secondary | ICD-10-CM

## 2023-10-03 DIAGNOSIS — Z803 Family history of malignant neoplasm of breast: Secondary | ICD-10-CM | POA: Insufficient documentation

## 2023-10-03 NOTE — Progress Notes (Signed)
 REFERRING PROVIDER: Center, [facility] [address] [place],  [place] [address]  PRIMARY PROVIDER:  Patient, No Pcp Per  PRIMARY REASON FOR VISIT:  1. Family history of breast cancer   2. Family history of colon cancer   3. Family history of ovarian cancer    I connected with Ms. Zimny on 10/03/2023 at 3:00 PM EDT by telephone and verified that I am speaking with the correct person using three identifiers.    Patient location: home Provider location: [facility]   HISTORY OF PRESENT ILLNESS:   Ms. Suppa, a 38 y.o. female, was seen for a [place] cancer genetics consultation due to a family history of cancer.  Ms. Nuccio presents to clinic today to discuss the possibility of a hereditary predisposition to cancer, genetic testing, and to further clarify her future cancer risks, as well as potential cancer risks for family members.   CANCER HISTORY:  Ms. Skyles is a 38 y.o. female with no personal history of cancer.     RELEVANT MEDICAL HISTORY:  Menarche was at age 30.  First live birth at age 43.  Ovaries intact: yes.  Hysterectomy: no.  Menopausal status: premenopausal.  Colonoscopy: yes; normal. Mammogram within the last year: no. Number of breast biopsies: 0.  Past Medical History:  Diagnosis Date   Bipolar 1 disorder (HCC)    Depression    OCD (obsessive compulsive disorder)     Past Surgical History:  Procedure Laterality Date   dilitation and curretage     TUBAL LIGATION      FAMILY HISTORY:  We obtained a detailed, 4-generation family history.  Significant diagnoses are listed below: Family History  Problem Relation Age of Onset   Ovarian cancer Mother 40   Breast cancer Maternal Grandmother 50   Colon cancer Maternal Grandmother        dx 110s   Liver cancer Maternal Grandmother        dx 26s   Ms. Guyett has 2 daughters, 19 and 11. She has 1 sister, 51.   Ms. Baysinger mother had ovarian cancer at 52, no genetic testing.  Patient does not have much communication with her. Maternal grandmother had breast cancer at 78 and then colon/liver cancer in her 39s which she passed from. No other known cancers in the family.  Ms. Goldwasser is unaware of previous family history of genetic testing for hereditary cancer risks. There is no reported Ashkenazi Jewish ancestry. There is no known consanguinity.    GENETIC COUNSELING ASSESSMENT: Ms. Munoz is a 38 y.o. female with a family history of breast and ovarian cancer which is somewhat suggestive of a hereditary cancer syndrome and predisposition to cancer. We, therefore, discussed and recommended the following at today's visit.   DISCUSSION: We discussed that, in general, most cancer is not inherited in families, but instead is sporadic or familial. Sporadic cancers occur by chance and typically happen at older ages (>50 years) as this type of cancer is caused by genetic changes acquired during an individual's lifetime. Some families have more cancers than would be expected by chance; however, the ages or types of cancer are not consistent with a known genetic mutation or known genetic mutations have been ruled out. This type of familial cancer is thought to be due to a combination of multiple genetic, environmental, hormonal, and lifestyle factors. While this combination of factors likely increases the risk of cancer, the exact source of this risk is not currently identifiable or testable.  We discussed that approximately 10% of cancer is hereditary. Most cases of hereditary breast/ovarian cancer are associated with BRCA1/BRCA2 genes, although there are other genes associated with hereditary cancer as well. Cancers and risks are gene specific. We discussed that testing is beneficial for several reasons including knowing about cancer risks, identifying potential screening and risk-reduction options that may be appropriate, and to understand if other family members could be at risk  for cancer and allow them to undergo genetic testing.   We reviewed the characteristics, features and inheritance patterns of hereditary cancer syndromes. We also discussed genetic testing, including the appropriate family members to test, the process of testing, insurance coverage and turn-around-time for results. We discussed the implications of a negative, positive and/or variant of uncertain significant result. We recommended Ms. Nesler pursue genetic testing for the [facility] CancerNext-Expanded+RNA gene panel.   Based on Ms. Becraft's family history of cancer, she meets medical criteria for genetic testing. Though Ms. Hechavarria is not personally affected, there are no affected family members that are willing/able to undergo hereditary cancer testing.  Therefore, Ms. Pitney the most informative family member available. Despite that she meets criteria, she may still have an out of pocket cost.   PLAN: After considering the risks, benefits, and limitations, Ms. Dupriest provided informed consent to pursue genetic testing. She will have her blood drawn tomorrow morning (9/24) and the blood sample will be sent to [facility] for analysis of the CancerNext-Expanded+RNA Panel. Results should be available within approximately 2-3 weeks' time, at which point they will be disclosed by telephone to Ms. Kaufhold, as will any additional recommendations warranted by these results. Ms. Heiden will receive a summary of her genetic counseling visit and a copy of her results once available. This information will also be available in Epic.   Ms. Westerhoff questions were answered to her satisfaction today. Our contact information was provided should additional questions or concerns arise. Thank you for the referral and allowing us  to share in the care of your patient.   Dena Cary, MS, [facility] Genetic Counselor [email or web address] Phone: [phone]  40 minutes were spent on the date of the  encounter in service to the patient including preparation, virtual consultation, documentation and care coordination. Dr. Delinda was available for discussion regarding this case.   _______________________________________________________________________ For Office Staff:  Number of people involved in session: 1 Was an Intern/ student involved with case: no

## 2023-10-04 ENCOUNTER — Inpatient Hospital Stay: Attending: Genetic Counselor

## 2023-10-04 DIAGNOSIS — Z1379 Encounter for other screening for genetic and chromosomal anomalies: Secondary | ICD-10-CM

## 2023-10-04 LAB — GENETIC SCREENING ORDER

## 2023-10-17 ENCOUNTER — Encounter: Payer: Self-pay | Admitting: Licensed Clinical Social Worker

## 2023-10-17 ENCOUNTER — Telehealth: Payer: Self-pay | Admitting: Licensed Clinical Social Worker

## 2023-10-17 DIAGNOSIS — Z1379 Encounter for other screening for genetic and chromosomal anomalies: Secondary | ICD-10-CM | POA: Insufficient documentation

## 2023-10-18 ENCOUNTER — Ambulatory Visit: Payer: Self-pay | Admitting: Licensed Clinical Social Worker

## 2023-10-18 DIAGNOSIS — Z1379 Encounter for other screening for genetic and chromosomal anomalies: Secondary | ICD-10-CM

## 2023-10-18 NOTE — Progress Notes (Signed)
 HPI:   Sonya Roach was previously seen in the [facility] due to a family history of cancer and concerns regarding a hereditary predisposition to cancer. Please refer to our prior cancer genetics clinic note for more information regarding our discussion, assessment and recommendations, at the time. Sonya Roach recent genetic test results were disclosed to her, as were recommendations warranted by these results. These results and recommendations are discussed in more detail below.  CANCER HISTORY:  Oncology History   No history exists.    FAMILY HISTORY:  We obtained a detailed, 4-generation family history.  Significant diagnoses are listed below: Family History  Problem Relation Age of Onset   Ovarian cancer Mother 59   Breast cancer Maternal Grandmother 73   Colon cancer Maternal Grandmother        dx 12s   Liver cancer Maternal Grandmother        dx 34s   Sonya Roach has 2 daughters, 91 and 64. She has 1 sister, 5.    Sonya Roach mother had ovarian cancer at 20, no genetic testing. Patient does not have much communication with her. Maternal grandmother had breast cancer at 40 and then colon/liver cancer in her 43s which she passed from. No other known cancers in the family.   Sonya Roach is unaware of previous family history of genetic testing for hereditary cancer risks. There is no reported Ashkenazi Jewish ancestry. There is no known consanguinity.      GENETIC TEST RESULTS:  The [facility] CancerNext-Expanded+RNA Panel found no pathogenic mutations.   The CancerNext-Expanded gene panel offered by [facility] and includes sequencing, rearrangement, and RNA analysis for the following 77 genes: AIP, ALK, APC, ATM, AXIN2, BAP1, BARD1, BMPR1A, BRCA1, BRCA2, BRIP1, CDC73, CDH1, CDK4, CDKN1B, CDKN2A, CEBPA, CHEK2, CTNNA1, DDX41, DICER1, ETV6, FH, FLCN, GATA2, LZTR1, MAX, MBD4, MEN1, MET, MLH1, MSH2, MSH3, MSH6, MUTYH, NF1, NF2, NTHL1, PALB2, PHOX2B, PMS2,  POT1, PRKAR1A, PTCH1, PTEN, RAD51C, RAD51D, RB1, RET, RPS20, RUNX1, SDHA, SDHAF2, SDHB, SDHC, SDHD, SMAD4, SMARCA4, SMARCB1, SMARCE1, STK11, SUFU, TMEM127, TP53, TSC1, TSC2, VHL, and WT1 (sequencing and deletion/duplication); EGFR, HOXB13, KIT, MITF, PDGFRA, POLD1, and POLE (sequencing only); EPCAM and GREM1 (deletion/duplication only).   The test report has been scanned into EPIC and is located under the Molecular Pathology section of the Results Review tab.  A portion of the result report is included below for reference. Genetic testing reported out on 10/13/2023.      Even though a pathogenic variant was not identified, possible explanations for the cancer in the family may include: There may be no hereditary risk for cancer in the family. The cancers in Sonya Roach and/or her family may be sporadic/familial or due to other genetic and environmental factors. There may be a gene mutation in one of these genes that current testing methods cannot detect but that chance is small. There could be another gene that has not yet been discovered, or that we have not yet tested, that is responsible for the cancer diagnoses in the family.  It is also possible there is a hereditary cause for the cancer in the family that Sonya Roach did not inherit.  Therefore, it is important to remain in touch with cancer genetics in the future so that we can continue to offer Sonya Roach the most up to date genetic testing.   ADDITIONAL GENETIC TESTING:  We discussed with Sonya Roach that her genetic testing was fairly extensive.  If there are additional relevant genes identified  to increase cancer risk that can be analyzed in the future, we would be happy to discuss and coordinate this testing at that time.    CANCER SCREENING RECOMMENDATIONS:  Sonya Roach test result is considered negative (normal).  This means that we have not identified a hereditary cause for her family history of cancer at this time.   An  individual's cancer risk and medical management are not determined by genetic test results alone. Overall cancer risk assessment incorporates additional factors, including personal medical history, family history, and any available genetic information that may result in a personalized plan for cancer prevention and surveillance. Therefore, it is recommended she continue to follow the cancer management and screening guidelines provided by her  primary healthcare provider.  Breast Cancer Screening:  The Tyrer-Cuzick model is one of multiple prediction models developed to estimate an individual's lifetime risk of developing breast cancer. The Tyrer-Cuzick model is endorsed by the [employer] (NCCN). This model includes many risk factors such as family history, endogenous estrogen exposure, and benign breast disease. The calculation is highly-dependent on the accuracy of clinical data provided by the patient and can change over time. The Tyrer-Cuzick model may be repeated to reflect new information in her personal or family history in the future.    Sonya Roach'sTyrer-Cuzick risk score is 10.9%. She is encouraged to continue to be mindful of her family history and be diligent with general population breast screening, including annual mammograms beginning 10 years prior to the youngest diagnosis in her family or by age 53.  She is encouraged to contact us  regarding any changes to her personal or family history, as her recommendations for screening would be altered significantly if her lifetime risk is determined to be greater than 20% based on updated information.       RECOMMENDATIONS FOR FAMILY MEMBERS:   Since she did not inherit a identifiable mutation in a cancer predisposition gene included on this panel, her children could not have inherited a known mutation from her in one of these genes. Individuals in this family might be at some increased risk of developing cancer, over  the general population risk, due to the family history of cancer.  Individuals in the family should notify their providers of the family history of cancer. We recommend women in this family have a yearly mammogram beginning at age 60, or 54 years younger than the earliest onset of cancer, an annual clinical breast exam, and perform monthly breast self-exams.  Family members should have colonoscopies by at age 57, or earlier, as recommended by their providers. Other members of the family may still carry a pathogenic variant in one of these genes that Sonya Roach did not inherit. Based on the family history, we recommend her mother/maternal relatives have genetic counseling and testing. Sonya Roach will let us  know if we can be of any assistance in coordinating genetic counseling and/or testing for this family member.     FOLLOW-UP:  Lastly, we discussed with Sonya Roach that cancer genetics is a rapidly advancing field and it is possible that new genetic tests will be appropriate for her and/or her family members in the future. We encouraged her to remain in contact with cancer genetics on an annual basis so we can update her personal and family histories and let her know of advances in cancer genetics that may benefit this family.   Our contact number was provided. Sonya Roach questions were answered to her satisfaction, and she knows she  is welcome to call us  at anytime with additional questions or concerns.    Dena Cary, MS, [facility] Genetic Counselor [email or web address] Phone: [phone]

## 2023-10-18 NOTE — Telephone Encounter (Signed)
 I contacted Ms. Gearhart to discuss her genetic testing results. No pathogenic variants were identified in the 77 genes analyzed. Detailed clinic note to follow.   The test report has been scanned into EPIC and is located under the Molecular Pathology section of the Results Review tab.  A portion of the result report is included below for reference.      Dena Cary, MS, [facility] Genetic Counselor [email or web address] Phone: [phone]

## 2023-11-28 ENCOUNTER — Ambulatory Visit: Admitting: Family Medicine

## 2023-12-16 ENCOUNTER — Other Ambulatory Visit: Payer: Self-pay

## 2023-12-16 ENCOUNTER — Emergency Department (HOSPITAL_COMMUNITY)

## 2023-12-16 ENCOUNTER — Encounter (HOSPITAL_COMMUNITY): Payer: Self-pay | Admitting: *Deleted

## 2023-12-16 ENCOUNTER — Inpatient Hospital Stay (HOSPITAL_COMMUNITY)
Admission: EM | Admit: 2023-12-16 | Discharge: 2023-12-17 | DRG: 965 | Discharge status: Against Medical Advice | Attending: Surgery | Admitting: Surgery

## 2023-12-16 DIAGNOSIS — S82891A Other fracture of right lower leg, initial encounter for closed fracture: Principal | ICD-10-CM | POA: Diagnosis present

## 2023-12-16 DIAGNOSIS — S069X9A Unspecified intracranial injury with loss of consciousness of unspecified duration, initial encounter: Secondary | ICD-10-CM | POA: Diagnosis present

## 2023-12-16 DIAGNOSIS — I959 Hypotension, unspecified: Secondary | ICD-10-CM | POA: Diagnosis present

## 2023-12-16 DIAGNOSIS — Z800 Family history of malignant neoplasm of digestive organs: Secondary | ICD-10-CM

## 2023-12-16 DIAGNOSIS — Z803 Family history of malignant neoplasm of breast: Secondary | ICD-10-CM

## 2023-12-16 DIAGNOSIS — S8781XA Crushing injury of right lower leg, initial encounter: Secondary | ICD-10-CM | POA: Diagnosis present

## 2023-12-16 DIAGNOSIS — Z888 Allergy status to other drugs, medicaments and biological substances status: Secondary | ICD-10-CM

## 2023-12-16 DIAGNOSIS — F4310 Post-traumatic stress disorder, unspecified: Secondary | ICD-10-CM | POA: Diagnosis present

## 2023-12-16 DIAGNOSIS — F17210 Nicotine dependence, cigarettes, uncomplicated: Secondary | ICD-10-CM | POA: Diagnosis present

## 2023-12-16 DIAGNOSIS — Z8041 Family history of malignant neoplasm of ovary: Secondary | ICD-10-CM

## 2023-12-16 DIAGNOSIS — Z881 Allergy status to other antibiotic agents status: Secondary | ICD-10-CM

## 2023-12-16 DIAGNOSIS — F319 Bipolar disorder, unspecified: Secondary | ICD-10-CM | POA: Diagnosis present

## 2023-12-16 DIAGNOSIS — Z9851 Tubal ligation status: Secondary | ICD-10-CM

## 2023-12-16 DIAGNOSIS — S8251XA Displaced fracture of medial malleolus of right tibia, initial encounter for closed fracture: Principal | ICD-10-CM | POA: Diagnosis present

## 2023-12-16 DIAGNOSIS — W230XXA Caught, crushed, jammed, or pinched between moving objects, initial encounter: Secondary | ICD-10-CM | POA: Diagnosis present

## 2023-12-16 DIAGNOSIS — F909 Attention-deficit hyperactivity disorder, unspecified type: Secondary | ICD-10-CM | POA: Diagnosis present

## 2023-12-16 DIAGNOSIS — F429 Obsessive-compulsive disorder, unspecified: Secondary | ICD-10-CM | POA: Diagnosis present

## 2023-12-16 DIAGNOSIS — Z5329 Procedure and treatment not carried out because of patient's decision for other reasons: Secondary | ICD-10-CM | POA: Diagnosis present

## 2023-12-16 LAB — CBC WITH DIFFERENTIAL/PLATELET
Abs Immature Granulocytes: 0.05 K/uL (ref 0.00–0.07)
Basophils Absolute: 0 K/uL (ref 0.0–0.1)
Basophils Relative: 0 %
Eosinophils Absolute: 0.2 K/uL (ref 0.0–0.5)
Eosinophils Relative: 1 %
HCT: 40.9 % (ref 36.0–46.0)
Hemoglobin: 13.5 g/dL (ref 12.0–15.0)
Immature Granulocytes: 0 %
Lymphocytes Relative: 24 %
Lymphs Abs: 3.2 K/uL (ref 0.7–4.0)
MCH: 30.3 pg (ref 26.0–34.0)
MCHC: 33 g/dL (ref 30.0–36.0)
MCV: 91.7 fL (ref 80.0–100.0)
Monocytes Absolute: 0.5 K/uL (ref 0.1–1.0)
Monocytes Relative: 4 %
Neutro Abs: 9.1 K/uL — ABNORMAL HIGH (ref 1.7–7.7)
Neutrophils Relative %: 71 %
Platelets: 339 K/uL (ref 150–400)
RBC: 4.46 MIL/uL (ref 3.87–5.11)
RDW: 13.2 % (ref 11.5–15.5)
WBC: 13 K/uL — ABNORMAL HIGH (ref 4.0–10.5)
nRBC: 0 % (ref 0.0–0.2)

## 2023-12-16 LAB — COMPREHENSIVE METABOLIC PANEL WITH GFR
ALT: 15 U/L (ref 0–44)
AST: 29 U/L (ref 15–41)
Albumin: 4.1 g/dL (ref 3.5–5.0)
Alkaline Phosphatase: 50 U/L (ref 38–126)
Anion gap: 12 (ref 5–15)
BUN: 14 mg/dL (ref 6–20)
CO2: 20 mmol/L — ABNORMAL LOW (ref 22–32)
Calcium: 9.4 mg/dL (ref 8.9–10.3)
Chloride: 103 mmol/L (ref 98–111)
Creatinine, Ser: 0.57 mg/dL (ref 0.44–1.00)
GFR, Estimated: >60 mL/min (ref >60)
Glucose, Bld: 91 mg/dL (ref 70–99)
Potassium: 3.8 mmol/L (ref 3.5–5.1)
Sodium: 135 mmol/L (ref 135–145)
Total Bilirubin: 0.3 mg/dL (ref 0.0–1.2)
Total Protein: 7.4 g/dL (ref 6.5–8.1)

## 2023-12-16 LAB — I-STAT CHEM 8, ED
BUN: 14 mg/dL (ref 6–20)
Calcium, Ion: 1.04 mmol/L — ABNORMAL LOW (ref 1.15–1.40)
Chloride: 106 mmol/L (ref 98–111)
Creatinine, Ser: 0.8 mg/dL (ref 0.44–1.00)
Glucose, Bld: 91 mg/dL (ref 70–99)
HCT: 41 % (ref 36.0–46.0)
Hemoglobin: 13.9 g/dL (ref 12.0–15.0)
Potassium: 3.8 mmol/L (ref 3.5–5.1)
Sodium: 140 mmol/L (ref 135–145)
TCO2: 19 mmol/L — ABNORMAL LOW (ref 22–32)

## 2023-12-16 LAB — I-STAT CG4 LACTIC ACID, ED: Lactic Acid, Venous: 2.4 mmol/L (ref 0.5–1.9)

## 2023-12-16 LAB — CK: Total CK: 81 U/L (ref 38–234)

## 2023-12-16 LAB — MAGNESIUM: Magnesium: 2.2 mg/dL (ref 1.7–2.4)

## 2023-12-16 MED ORDER — IOHEXOL 350 MG/ML SOLN
100.0000 mL | Freq: Once | INTRAVENOUS | Status: AC | PRN
  Administered 2023-12-16: 100 mL via INTRAVENOUS

## 2023-12-16 MED ORDER — OXYCODONE-ACETAMINOPHEN 5-325 MG PO TABS
1.0000 | ORAL_TABLET | Freq: Once | ORAL | Status: AC
  Administered 2023-12-16: 1 via ORAL
  Filled 2023-12-16: qty 1

## 2023-12-16 MED ORDER — LACTATED RINGERS IV BOLUS
1000.0000 mL | Freq: Once | INTRAVENOUS | Status: AC
  Administered 2023-12-16: 1000 mL via INTRAVENOUS

## 2023-12-16 MED ORDER — ONDANSETRON 4 MG PO TBDP
8.0000 mg | ORAL_TABLET | Freq: Once | ORAL | Status: AC
  Administered 2023-12-16: 8 mg via ORAL
  Filled 2023-12-16: qty 2
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
## 2023-12-16 NOTE — ED Notes (Signed)
 Pt verbalized need to void.  RN was able to assist pt on bed pan and pt voided.  Pt denies any needs or requests at this time.  RN explained call light use and  left call light with patient.
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
## 2023-12-16 NOTE — ED Notes (Signed)
 RN called lab to ensure they run all blood labs as tubes have already been sent
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
## 2023-12-17 DIAGNOSIS — Z5329 Procedure and treatment not carried out because of patient's decision for other reasons: Secondary | ICD-10-CM | POA: Diagnosis present

## 2023-12-17 DIAGNOSIS — S069X9A Unspecified intracranial injury with loss of consciousness of unspecified duration, initial encounter: Secondary | ICD-10-CM | POA: Diagnosis present

## 2023-12-17 DIAGNOSIS — S82891A Other fracture of right lower leg, initial encounter for closed fracture: Principal | ICD-10-CM | POA: Diagnosis present

## 2023-12-17 DIAGNOSIS — Z888 Allergy status to other drugs, medicaments and biological substances status: Secondary | ICD-10-CM | POA: Diagnosis not present

## 2023-12-17 DIAGNOSIS — S8781XA Crushing injury of right lower leg, initial encounter: Secondary | ICD-10-CM | POA: Diagnosis present

## 2023-12-17 DIAGNOSIS — F429 Obsessive-compulsive disorder, unspecified: Secondary | ICD-10-CM | POA: Diagnosis present

## 2023-12-17 DIAGNOSIS — F17210 Nicotine dependence, cigarettes, uncomplicated: Secondary | ICD-10-CM | POA: Diagnosis present

## 2023-12-17 DIAGNOSIS — Z800 Family history of malignant neoplasm of digestive organs: Secondary | ICD-10-CM | POA: Diagnosis not present

## 2023-12-17 DIAGNOSIS — S8251XA Displaced fracture of medial malleolus of right tibia, initial encounter for closed fracture: Secondary | ICD-10-CM | POA: Diagnosis present

## 2023-12-17 DIAGNOSIS — W230XXA Caught, crushed, jammed, or pinched between moving objects, initial encounter: Secondary | ICD-10-CM | POA: Diagnosis present

## 2023-12-17 DIAGNOSIS — F319 Bipolar disorder, unspecified: Secondary | ICD-10-CM | POA: Diagnosis present

## 2023-12-17 DIAGNOSIS — Z8041 Family history of malignant neoplasm of ovary: Secondary | ICD-10-CM | POA: Diagnosis not present

## 2023-12-17 DIAGNOSIS — Z881 Allergy status to other antibiotic agents status: Secondary | ICD-10-CM | POA: Diagnosis not present

## 2023-12-17 DIAGNOSIS — F909 Attention-deficit hyperactivity disorder, unspecified type: Secondary | ICD-10-CM | POA: Diagnosis present

## 2023-12-17 DIAGNOSIS — I959 Hypotension, unspecified: Secondary | ICD-10-CM | POA: Diagnosis present

## 2023-12-17 DIAGNOSIS — Z803 Family history of malignant neoplasm of breast: Secondary | ICD-10-CM | POA: Diagnosis not present

## 2023-12-17 DIAGNOSIS — Z9851 Tubal ligation status: Secondary | ICD-10-CM | POA: Diagnosis not present

## 2023-12-17 DIAGNOSIS — F4310 Post-traumatic stress disorder, unspecified: Secondary | ICD-10-CM | POA: Diagnosis present

## 2023-12-17 LAB — CBC
HCT: 38.3 % (ref 36.0–46.0)
Hemoglobin: 12.7 g/dL (ref 12.0–15.0)
MCH: 30.3 pg (ref 26.0–34.0)
MCHC: 33.2 g/dL (ref 30.0–36.0)
MCV: 91.4 fL (ref 80.0–100.0)
Platelets: 310 K/uL (ref 150–400)
RBC: 4.19 MIL/uL (ref 3.87–5.11)
RDW: 13.2 % (ref 11.5–15.5)
WBC: 16.6 K/uL — ABNORMAL HIGH (ref 4.0–10.5)
nRBC: 0 % (ref 0.0–0.2)

## 2023-12-17 LAB — HIV ANTIBODY (ROUTINE TESTING W REFLEX): HIV Screen 4th Generation wRfx: NONREACTIVE

## 2023-12-17 LAB — BASIC METABOLIC PANEL WITH GFR
Anion gap: 13 (ref 5–15)
BUN: 9 mg/dL (ref 6–20)
CO2: 21 mmol/L — ABNORMAL LOW (ref 22–32)
Calcium: 8.9 mg/dL (ref 8.9–10.3)
Chloride: 105 mmol/L (ref 98–111)
Creatinine, Ser: 0.7 mg/dL (ref 0.44–1.00)
GFR, Estimated: >60 mL/min (ref >60)
Glucose, Bld: 88 mg/dL (ref 70–99)
Potassium: 4 mmol/L (ref 3.5–5.1)
Sodium: 139 mmol/L (ref 135–145)

## 2023-12-17 LAB — I-STAT CG4 LACTIC ACID, ED: Lactic Acid, Venous: 2.4 mmol/L (ref 0.5–1.9)

## 2023-12-17 MED ORDER — HYDROMORPHONE HCL 1 MG/ML IJ SOLN: 0.5000 mg | INTRAMUSCULAR | Status: DC | PRN

## 2023-12-17 MED ORDER — METHOCARBAMOL 1000 MG/10ML IJ SOLN: 500.0000 mg | Freq: Three times a day (TID) | INTRAMUSCULAR | Status: DC

## 2023-12-17 MED ORDER — SODIUM CHLORIDE 0.9 % IV SOLN: INTRAVENOUS | Status: DC

## 2023-12-17 MED ORDER — METOPROLOL TARTRATE 5 MG/5ML IV SOLN: 5.0000 mg | Freq: Four times a day (QID) | INTRAVENOUS | Status: DC | PRN

## 2023-12-17 MED ORDER — ONDANSETRON 4 MG PO TBDP: 4.0000 mg | ORAL_TABLET | Freq: Four times a day (QID) | ORAL | Status: DC | PRN

## 2023-12-17 MED ORDER — HYDRALAZINE HCL 20 MG/ML IJ SOLN: 10.0000 mg | INTRAMUSCULAR | Status: DC | PRN

## 2023-12-17 MED ORDER — OXYCODONE HCL 5 MG PO TABS: 10.0000 mg | ORAL_TABLET | ORAL | Status: DC | PRN

## 2023-12-17 MED ORDER — METHOCARBAMOL 500 MG PO TABS: 500.0000 mg | ORAL_TABLET | Freq: Three times a day (TID) | ORAL | Status: DC

## 2023-12-17 MED ORDER — DOCUSATE SODIUM 100 MG PO CAPS: 100.0000 mg | ORAL_CAPSULE | Freq: Two times a day (BID) | ORAL | Status: DC

## 2023-12-17 MED ORDER — ONDANSETRON HCL 4 MG/2ML IJ SOLN: 4.0000 mg | Freq: Four times a day (QID) | INTRAMUSCULAR | Status: DC | PRN

## 2023-12-17 MED ORDER — ACETAMINOPHEN 500 MG PO TABS: 1000.0000 mg | ORAL_TABLET | Freq: Four times a day (QID) | ORAL | Status: DC

## 2023-12-17 MED ORDER — ENOXAPARIN SODIUM 30 MG/0.3ML IJ SOSY: 30.0000 mg | PREFILLED_SYRINGE | Freq: Two times a day (BID) | INTRAMUSCULAR | Status: DC

## 2023-12-17 MED ORDER — POLYETHYLENE GLYCOL 3350 17 G PO PACK: 17.0000 g | PACK | Freq: Every day | ORAL | Status: DC | PRN

## 2023-12-17 MED ORDER — OXYCODONE HCL 5 MG PO TABS: 5.0000 mg | ORAL_TABLET | ORAL | Status: DC | PRN

## 2023-12-17 NOTE — Progress Notes (Signed)
 Orthopedic Tech Progress Note Patient Details:  Sonya Roach 08-30-1985 [id number]  Ortho Devices Type of Ortho Device: CAM walker Ortho Device/Splint Location: rle Ortho Device/Splint Interventions: Ordered, Application, Adjustment   Post Interventions Patient Tolerated: Well Instructions Provided: Care of device, Adjustment of device  Chandra Dorn PARAS 12/17/2023, 12:37 AM

## 2023-12-17 NOTE — ED Notes (Signed)
 Trauma Event Note    Reason for Call :  Patient wanting to leave AMA at this time. TRN notified Trauma MD Stechschulte. Explained to the patient importance of staying for admission. Patient alert and oriented x4 at this time. Patient is still wanting to leave after discussion with primary RN and TRN. Trauma MD aware of patient leaving AMA at this time.  Last imported Vital Signs BP 107/73   Pulse 77   Temp (!) 97.3 F (36.3 C)   Resp 14   Ht 4' 10 (1.473 m)   Wt 132 lb 15 oz (60.3 kg)   LMP 12/01/2023   SpO2 100%   BMI 27.78 kg/m   Trending CBC Recent Labs    12/16/23 2234 12/16/23 2247 12/17/23 0022  WBC 13.0*  --  16.6*  HGB 13.5 13.9 12.7  HCT 40.9 41.0 38.3  PLT 339  --  310    Trending Coag's No results for input(s): APTT, INR in the last 72 hours.  Trending BMET Recent Labs    12/16/23 2234 12/16/23 2247  NA 135 140  K 3.8 3.8  CL 103 106  CO2 20*  --   BUN 14 14  CREATININE 0.57 0.80  GLUCOSE 91 91      Roanne Haye  Trauma Response RN  Please call TRN at [phone] for further assistance.

## 2023-12-17 NOTE — ED Notes (Signed)
 Lab results was reported to Nurse Caron.

## 2023-12-17 NOTE — ED Notes (Signed)
 RN entered room to care for pt.  RN noted that pt self removed IV.  Pt stated she is leaving.  RN discussed importance of staying in hospital to receive care.  Pt persisted.  This RN called trauma RN and notified of pt intent.  Trauma RN spoke with trauma MD and then spoke with patient.  Pt continued to verbalize intent to leave.    This RN attempted to talk to patient again and pt refused to stay.

## 2023-12-18 ENCOUNTER — Other Ambulatory Visit: Payer: Self-pay

## 2023-12-18 ENCOUNTER — Emergency Department (HOSPITAL_COMMUNITY)

## 2023-12-18 ENCOUNTER — Ambulatory Visit: Admission: EM | Admit: 2023-12-18 | Discharge: 2023-12-18 | Disposition: A | Discharge status: Home / Self Care

## 2023-12-18 ENCOUNTER — Encounter (HOSPITAL_COMMUNITY): Payer: Self-pay

## 2023-12-18 ENCOUNTER — Emergency Department (HOSPITAL_COMMUNITY)
Admission: EM | Admit: 2023-12-18 | Discharge: 2023-12-18 | Disposition: A | Discharge status: Home / Self Care | Attending: Emergency Medicine | Admitting: Emergency Medicine

## 2023-12-18 ENCOUNTER — Encounter: Payer: Self-pay | Admitting: Emergency Medicine

## 2023-12-18 DIAGNOSIS — S8251XA Displaced fracture of medial malleolus of right tibia, initial encounter for closed fracture: Secondary | ICD-10-CM

## 2023-12-18 DIAGNOSIS — S82891A Other fracture of right lower leg, initial encounter for closed fracture: Secondary | ICD-10-CM

## 2023-12-18 LAB — I-STAT CHEM 8, ED
BUN: 13 mg/dL (ref 6–20)
Calcium, Ion: 0.99 mmol/L — ABNORMAL LOW (ref 1.15–1.40)
Chloride: 105 mmol/L (ref 98–111)
Creatinine, Ser: 0.7 mg/dL (ref 0.44–1.00)
Glucose, Bld: 131 mg/dL — ABNORMAL HIGH (ref 70–99)
HCT: 36 % (ref 36.0–46.0)
Hemoglobin: 12.2 g/dL (ref 12.0–15.0)
Potassium: 3.9 mmol/L (ref 3.5–5.1)
Sodium: 136 mmol/L (ref 135–145)
TCO2: 23 mmol/L (ref 22–32)

## 2023-12-18 LAB — COMPREHENSIVE METABOLIC PANEL WITH GFR
ALT: 13 U/L (ref 0–44)
AST: 29 U/L (ref 15–41)
Albumin: 3.4 g/dL — ABNORMAL LOW (ref 3.5–5.0)
Alkaline Phosphatase: 44 U/L (ref 38–126)
Anion gap: 10 (ref 5–15)
BUN: 11 mg/dL (ref 6–20)
CO2: 20 mmol/L — ABNORMAL LOW (ref 22–32)
Calcium: 8.6 mg/dL — ABNORMAL LOW (ref 8.9–10.3)
Chloride: 105 mmol/L (ref 98–111)
Creatinine, Ser: 0.84 mg/dL (ref 0.44–1.00)
GFR, Estimated: >60 mL/min (ref >60)
Glucose, Bld: 131 mg/dL — ABNORMAL HIGH (ref 70–99)
Potassium: 3.5 mmol/L (ref 3.5–5.1)
Sodium: 135 mmol/L (ref 135–145)
Total Bilirubin: 0.6 mg/dL (ref 0.0–1.2)
Total Protein: 6.3 g/dL — ABNORMAL LOW (ref 6.5–8.1)

## 2023-12-18 LAB — CBC WITH DIFFERENTIAL/PLATELET
Abs Immature Granulocytes: 0.08 K/uL — ABNORMAL HIGH (ref 0.00–0.07)
Basophils Absolute: 0 K/uL (ref 0.0–0.1)
Basophils Relative: 0 %
Eosinophils Absolute: 0.1 K/uL (ref 0.0–0.5)
Eosinophils Relative: 1 %
HCT: 36.1 % (ref 36.0–46.0)
Hemoglobin: 11.6 g/dL — ABNORMAL LOW (ref 12.0–15.0)
Immature Granulocytes: 1 %
Lymphocytes Relative: 15 %
Lymphs Abs: 2.1 K/uL (ref 0.7–4.0)
MCH: 30.2 pg (ref 26.0–34.0)
MCHC: 32.1 g/dL (ref 30.0–36.0)
MCV: 94 fL (ref 80.0–100.0)
Monocytes Absolute: 0.9 K/uL (ref 0.1–1.0)
Monocytes Relative: 6 %
Neutro Abs: 10.9 K/uL — ABNORMAL HIGH (ref 1.7–7.7)
Neutrophils Relative %: 77 %
Platelets: 268 K/uL (ref 150–400)
RBC: 3.84 MIL/uL — ABNORMAL LOW (ref 3.87–5.11)
RDW: 13.3 % (ref 11.5–15.5)
WBC: 14 K/uL — ABNORMAL HIGH (ref 4.0–10.5)
nRBC: 0 % (ref 0.0–0.2)

## 2023-12-18 LAB — CK: Total CK: 211 U/L (ref 38–234)

## 2023-12-18 MED ORDER — METHOCARBAMOL 500 MG PO TABS: 500.0000 mg | ORAL_TABLET | Freq: Two times a day (BID) | ORAL | 0 refills | Status: DC

## 2023-12-18 MED ORDER — OXYCODONE-ACETAMINOPHEN 5-325 MG PO TABS: 1.0000 | ORAL_TABLET | Freq: Four times a day (QID) | ORAL | 0 refills | Status: DC | PRN

## 2023-12-18 MED ORDER — HYDROCODONE-ACETAMINOPHEN 5-325 MG PO TABS: 1.0000 | ORAL_TABLET | ORAL | 0 refills | Status: DC | PRN

## 2023-12-18 MED ORDER — LACTATED RINGERS IV SOLN: INTRAVENOUS | Status: DC

## 2023-12-18 MED ORDER — MORPHINE SULFATE (PF) 4 MG/ML IV SOLN
8.0000 mg | Freq: Once | INTRAVENOUS | Status: AC
  Administered 2023-12-18: 8 mg via INTRAVENOUS
  Filled 2023-12-18: qty 2

## 2023-12-18 NOTE — Discharge Instructions (Signed)
Follow up with ortho urgent care.
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
## 2023-12-19 ENCOUNTER — Telehealth: Payer: Self-pay
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
## 2023-12-19 NOTE — Telephone Encounter (Signed)
 Received call from patient regarding her RXs. Per patient, they were sent to the wrong pharmacy. Encouraged her to have them transferred to her pharmacy of choice. However, her percocet could not be transferred. MD that wrote RX is not currently on service to send to alternate pharmacy. Patient encouraged to f/u with her pcp for additional medication if needed.   Merilee Batty, MSN, RN Case Management [phone]

## 2024-01-04 ENCOUNTER — Emergency Department (HOSPITAL_COMMUNITY)
Admission: EM | Admit: 2024-01-04 | Discharge: 2024-01-05 | Disposition: A | Discharge status: Home / Self Care | Attending: Emergency Medicine | Admitting: Emergency Medicine

## 2024-01-04 ENCOUNTER — Encounter (HOSPITAL_COMMUNITY): Payer: Self-pay

## 2024-01-04 ENCOUNTER — Emergency Department (HOSPITAL_COMMUNITY)

## 2024-01-04 ENCOUNTER — Other Ambulatory Visit: Payer: Self-pay

## 2024-01-04 DIAGNOSIS — R519 Headache, unspecified: Secondary | ICD-10-CM | POA: Insufficient documentation

## 2024-01-04 DIAGNOSIS — Z7901 Long term (current) use of anticoagulants: Secondary | ICD-10-CM | POA: Insufficient documentation

## 2024-01-04 DIAGNOSIS — R0789 Other chest pain: Secondary | ICD-10-CM | POA: Diagnosis present

## 2024-01-04 LAB — CBC WITH DIFFERENTIAL/PLATELET
Abs Immature Granulocytes: 0.02 K/uL (ref 0.00–0.07)
Basophils Absolute: 0 K/uL (ref 0.0–0.1)
Basophils Relative: 0 %
Eosinophils Absolute: 0.3 K/uL (ref 0.0–0.5)
Eosinophils Relative: 3 %
HCT: 36.3 % (ref 36.0–46.0)
Hemoglobin: 12.2 g/dL (ref 12.0–15.0)
Immature Granulocytes: 0 %
Lymphocytes Relative: 33 %
Lymphs Abs: 2.9 K/uL (ref 0.7–4.0)
MCH: 30.8 pg (ref 26.0–34.0)
MCHC: 33.6 g/dL (ref 30.0–36.0)
MCV: 91.7 fL (ref 80.0–100.0)
Monocytes Absolute: 0.7 K/uL (ref 0.1–1.0)
Monocytes Relative: 8 %
Neutro Abs: 5 K/uL (ref 1.7–7.7)
Neutrophils Relative %: 56 %
Platelets: 399 K/uL (ref 150–400)
RBC: 3.96 MIL/uL (ref 3.87–5.11)
RDW: 13.2 % (ref 11.5–15.5)
WBC: 9 K/uL (ref 4.0–10.5)
nRBC: 0 % (ref 0.0–0.2)

## 2024-01-04 LAB — COMPREHENSIVE METABOLIC PANEL WITH GFR
ALT: 12 U/L (ref 0–44)
AST: 27 U/L (ref 15–41)
Albumin: 4.3 g/dL (ref 3.5–5.0)
Alkaline Phosphatase: 61 U/L (ref 38–126)
Anion gap: 9 (ref 5–15)
BUN: 19 mg/dL (ref 6–20)
CO2: 24 mmol/L (ref 22–32)
Calcium: 9.7 mg/dL (ref 8.9–10.3)
Chloride: 105 mmol/L (ref 98–111)
Creatinine, Ser: 0.74 mg/dL (ref 0.44–1.00)
GFR, Estimated: >60 mL/min
Glucose, Bld: 107 mg/dL — ABNORMAL HIGH (ref 70–99)
Potassium: 3.7 mmol/L (ref 3.5–5.1)
Sodium: 138 mmol/L (ref 135–145)
Total Bilirubin: <0.2 mg/dL (ref 0.0–1.2)
Total Protein: 7.2 g/dL (ref 6.5–8.1)

## 2024-01-04 LAB — TROPONIN T, HIGH SENSITIVITY: Troponin T High Sensitivity: <15 ng/L (ref 0–19)

## 2024-01-04 LAB — HCG, SERUM, QUALITATIVE: Preg, Serum: NEGATIVE

## 2024-01-04 MED ORDER — DIPHENHYDRAMINE HCL 50 MG/ML IJ SOLN
25.0000 mg | Freq: Once | INTRAMUSCULAR | Status: AC
  Administered 2024-01-04: 25 mg via INTRAVENOUS
  Filled 2024-01-04: qty 1

## 2024-01-04 MED ORDER — METOCLOPRAMIDE HCL 5 MG/ML IJ SOLN
10.0000 mg | Freq: Once | INTRAMUSCULAR | Status: AC
  Administered 2024-01-04: 10 mg via INTRAVENOUS
  Filled 2024-01-04: qty 2

## 2024-01-04 NOTE — ED Triage Notes (Signed)
 Arrives [facility] from home for sudden onset right sided chest pains that quickly resolved and changed to a traveling sensation throughout her right leg, right arm and  head.   Called her after hours surgical team who encouraged evaluation in ED.   Recent post op for right leg on 12/16 after injury on 12/6.   Compliant on Xarelto.

## 2024-01-04 NOTE — ED Provider Notes (Signed)
 " [place] EMERGENCY DEPARTMENT AT [facility] Provider Note   CSN: [id number] Arrival date & time: 01/04/24  2302     Patient presents with: No chief complaint on file.   Sonya Roach is a 38 y.o. female.  {Add pertinent medical, surgical, social history, OB history to YEP:67052} The history is provided by the patient.  Sonya Roach is a 38 y.o. female who presents to the Emergency Department complaining of chest pain.  She presents to the emergency department by EMS for evaluation of chest pain that started shortly prior to ED arrival.  She is status post ORIF of a right ankle fracture on December 16.  The injury was sustained on December 8.  She has been on prophylactic Xarelto.  She states that she developed a feeling of something traveling through her veins on the right side of her leg up to her right arm and chest.  She had associated chest discomfort.  Discomfort lasted for about 20 minutes and now it is gone.  She also has a mild headache.  No reported injuries since her initial accident.  No fevers, difficulty breathing, nausea, vomiting.  She does report heavy menstrual cycle.  She has no known medical problems.     Prior to Admission medications  Medication Sig Start Date End Date Taking? Authorizing Provider  amitriptyline (ELAVIL) 25 MG tablet 25 mg.    [provider]  B Complex-C-Folic Acid (RENA-VITE RX) 1 MG TABS Take by mouth.    [provider]  buPROPion  (WELLBUTRIN  XL) 150 MG 24 hr tablet Take 150 mg by mouth. 08/28/19   [provider]  doxycycline  (VIBRAMYCIN ) 100 MG capsule Take 1 capsule (100 mg total) by mouth 2 (two) times daily. 12/19/13   Rudy Carlin LABOR, MD  Ferrous Sulfate (IRON PO) Take by mouth.    [provider]  Ferrous Sulfate (IRON PO) Take by mouth.    [provider]  HYDROcodone -acetaminophen  (NORCO/VICODIN) 5-325 MG tablet Take 1 tablet by mouth every 4 (four) hours as needed  for severe pain (pain score 7-10). 12/18/23   Andra Corean BROCKS, PA-C  ibuprofen  (ADVIL ,MOTRIN ) 800 MG tablet Take 1 tablet (800 mg total) by mouth every 8 (eight) hours as needed. 04/22/14   Rudy Carlin LABOR, MD  ketorolac  (TORADOL ) 10 MG tablet Take 10 mg by mouth. 06/19/23   [provider]  lamoTRIgine  (LAMICTAL ) 25 MG tablet Take 25 mg by mouth. 08/28/19   [provider]  methocarbamol  (ROBAXIN ) 500 MG tablet Take 1 tablet (500 mg total) by mouth 2 (two) times daily. 12/18/23   Dasie Faden, MD  metroNIDAZOLE  (FLAGYL ) 500 MG tablet Take 1 tablet (500 mg total) by mouth 2 (two) times daily. 12/19/13   Rudy Carlin LABOR, MD  oxyCODONE -acetaminophen  (PERCOCET/ROXICET) 5-325 MG tablet Take 1 tablet by mouth every 6 (six) hours as needed for severe pain (pain score 7-10). 12/18/23   Dasie Faden, MD    Allergies: Patient has no known allergies.    Review of Systems  All other systems reviewed and are negative.   Updated Vital Signs LMP 12/01/2023   Physical Exam Vitals and nursing note reviewed.  Constitutional:      Appearance: She is well-developed.  HENT:     Head: Normocephalic and atraumatic.  Cardiovascular:     Rate and Rhythm: Normal rate and regular rhythm.     Heart sounds: No murmur heard. Pulmonary:     Effort: Pulmonary effort is normal. No  respiratory distress.     Breath sounds: Normal breath sounds.  Abdominal:     Palpations: Abdomen is soft.     Tenderness: There is no abdominal tenderness. There is no guarding or rebound.  Musculoskeletal:        General: No tenderness.     Comments: Cast in place to the right lower extremity.  Toes on the right foot are warm with brisk cap refill, wiggles them without difficulty.  Skin:    General: Skin is warm and dry.  Neurological:     Mental Status: She is alert and oriented to person, place, and time.     Comments: 5 out of 5 strength in all 4 extremities with sensation to light touch intact in all  4 extremities  Psychiatric:        Behavior: Behavior normal.     (all labs ordered are listed, but only abnormal results are displayed) Labs Reviewed - No data to display  EKG: None  Radiology: No results found.  {Document cardiac monitor, telemetry assessment procedure when appropriate:32947} Procedures   Medications Ordered in the ED  metoCLOPramide  (REGLAN ) injection 10 mg (has no administration in time range)      {Click here for ABCD2, HEART and other calculators REFRESH Note before signing:1}                              Medical Decision Making Amount and/or Complexity of Data Reviewed Labs: ordered. Radiology: ordered.  Risk Prescription drug management.   ***  {Document critical care time when appropriate  Document review of labs and clinical decision tools ie CHADS2VASC2, etc  Document your independent review of radiology images and any outside records  Document your discussion with family members, caretakers and with consultants  Document social determinants of health affecting pt's care  Document your decision making why or why not admission, treatments were needed:32947:::1}   Final diagnoses:  None    ED Discharge Orders     None        "

## 2024-01-05 ENCOUNTER — Emergency Department (HOSPITAL_COMMUNITY)

## 2024-01-05 ENCOUNTER — Encounter (HOSPITAL_COMMUNITY): Payer: Self-pay

## 2024-01-05 DIAGNOSIS — R0789 Other chest pain: Secondary | ICD-10-CM | POA: Diagnosis not present

## 2024-01-05 LAB — TROPONIN T, HIGH SENSITIVITY: Troponin T High Sensitivity: <15 ng/L (ref 0–19)

## 2024-01-05 MED ORDER — IOHEXOL 350 MG/ML SOLN
75.0000 mL | Freq: Once | INTRAVENOUS | Status: AC | PRN
  Administered 2024-01-05: 75 mL via INTRAVENOUS

## 2024-02-09 ENCOUNTER — Encounter (HOSPITAL_BASED_OUTPATIENT_CLINIC_OR_DEPARTMENT_OTHER): Payer: Self-pay | Admitting: Orthopedic Surgery

## 2024-02-16 ENCOUNTER — Ambulatory Visit (HOSPITAL_BASED_OUTPATIENT_CLINIC_OR_DEPARTMENT_OTHER)
Admission: RE | Admit: 2024-02-16 | Discharge: 2024-02-16 | Disposition: A | Attending: Orthopedic Surgery | Admitting: Orthopedic Surgery

## 2024-02-16 ENCOUNTER — Encounter (HOSPITAL_BASED_OUTPATIENT_CLINIC_OR_DEPARTMENT_OTHER): Payer: Self-pay | Admitting: Orthopedic Surgery

## 2024-02-16 ENCOUNTER — Encounter (HOSPITAL_BASED_OUTPATIENT_CLINIC_OR_DEPARTMENT_OTHER): Admitting: Anesthesiology

## 2024-02-16 ENCOUNTER — Encounter (HOSPITAL_BASED_OUTPATIENT_CLINIC_OR_DEPARTMENT_OTHER): Admission: RE | Disposition: A | Payer: Self-pay | Source: Home / Self Care | Attending: Orthopedic Surgery

## 2024-02-16 ENCOUNTER — Other Ambulatory Visit: Payer: Self-pay

## 2024-02-16 DIAGNOSIS — Z01818 Encounter for other preprocedural examination: Secondary | ICD-10-CM

## 2024-02-16 LAB — POCT PREGNANCY, URINE
Preg Test, Ur: NEGATIVE
Preg Test, Ur: NEGATIVE

## 2024-02-16 MED ORDER — ROPIVACAINE HCL 7.5 MG/ML IJ SOLN
INTRAMUSCULAR | Status: DC | PRN
  Administered 2024-02-16: 20 mL via PERINEURAL

## 2024-02-16 MED ORDER — AMISULPRIDE (ANTIEMETIC) 5 MG/2ML IV SOLN
INTRAVENOUS | Status: AC
  Filled 2024-02-16: qty 4

## 2024-02-16 MED ORDER — MIDAZOLAM HCL 2 MG/2ML IJ SOLN
INTRAMUSCULAR | Status: AC
  Filled 2024-02-16: qty 2

## 2024-02-16 MED ORDER — PROPOFOL 10 MG/ML IV BOLUS
INTRAVENOUS | Status: AC
  Filled 2024-02-16: qty 20

## 2024-02-16 MED ORDER — SODIUM CHLORIDE 0.9 % IR SOLN: Status: DC | PRN

## 2024-02-16 MED ORDER — AMISULPRIDE (ANTIEMETIC) 5 MG/2ML IV SOLN
10.0000 mg | Freq: Once | INTRAVENOUS | Status: AC | PRN
  Administered 2024-02-16: 10 mg via INTRAVENOUS

## 2024-02-16 MED ORDER — SODIUM CHLORIDE 0.9 % IV SOLN
12.5000 mg | INTRAVENOUS | Status: DC | PRN
  Filled 2024-02-16: qty 0.5

## 2024-02-16 MED ORDER — LIDOCAINE HCL (CARDIAC) PF 100 MG/5ML IV SOSY
PREFILLED_SYRINGE | INTRAVENOUS | Status: DC | PRN
  Administered 2024-02-16: 60 mg via INTRAVENOUS

## 2024-02-16 MED ORDER — LACTATED RINGERS IV SOLN: INTRAVENOUS | Status: DC

## 2024-02-16 MED ORDER — EPHEDRINE 5 MG/ML INJ
INTRAVENOUS | Status: AC
  Filled 2024-02-16: qty 5

## 2024-02-16 MED ORDER — GLYCOPYRROLATE 0.2 MG/ML IJ SOLN
INTRAMUSCULAR | Status: DC | PRN
  Administered 2024-02-16: .1 mg via INTRAVENOUS

## 2024-02-16 MED ORDER — OXYCODONE HCL 5 MG PO TABS
ORAL_TABLET | ORAL | Status: AC
  Filled 2024-02-16: qty 1

## 2024-02-16 MED ORDER — FENTANYL CITRATE (PF) 100 MCG/2ML IJ SOLN
25.0000 ug | INTRAMUSCULAR | Status: DC | PRN
  Administered 2024-02-16 (×4): 25 ug via INTRAVENOUS

## 2024-02-16 MED ORDER — LIDOCAINE 2% (20 MG/ML) 5 ML SYRINGE
INTRAMUSCULAR | Status: AC
  Filled 2024-02-16: qty 5

## 2024-02-16 MED ORDER — OXYCODONE HCL 5 MG PO TABS: 5.0000 mg | ORAL_TABLET | ORAL | 0 refills | Status: AC | PRN

## 2024-02-16 MED ORDER — MIDAZOLAM HCL (PF) 2 MG/2ML IJ SOLN
2.0000 mg | Freq: Once | INTRAMUSCULAR | Status: AC
  Administered 2024-02-16: 2 mg via INTRAVENOUS

## 2024-02-16 MED ORDER — FENTANYL CITRATE (PF) 100 MCG/2ML IJ SOLN
100.0000 ug | Freq: Once | INTRAMUSCULAR | Status: AC
  Administered 2024-02-16: 50 ug via INTRAVENOUS

## 2024-02-16 MED ORDER — PROPOFOL 10 MG/ML IV BOLUS
INTRAVENOUS | Status: DC | PRN
  Administered 2024-02-16: 50 mg via INTRAVENOUS
  Administered 2024-02-16: 150 mg via INTRAVENOUS

## 2024-02-16 MED ORDER — DEXAMETHASONE SOD PHOSPHATE PF 10 MG/ML IJ SOLN
INTRAMUSCULAR | Status: DC | PRN
  Administered 2024-02-16: 5 mg via INTRAVENOUS

## 2024-02-16 MED ORDER — FENTANYL CITRATE (PF) 100 MCG/2ML IJ SOLN
INTRAMUSCULAR | Status: AC
  Filled 2024-02-16: qty 2

## 2024-02-16 MED ORDER — CEFAZOLIN SODIUM-DEXTROSE 2-4 GM/100ML-% IV SOLN
2.0000 g | INTRAVENOUS | Status: AC
  Administered 2024-02-16: 2 g via INTRAVENOUS

## 2024-02-16 MED ORDER — CEFAZOLIN SODIUM-DEXTROSE 2-4 GM/100ML-% IV SOLN
INTRAVENOUS | Status: AC
  Filled 2024-02-16: qty 100

## 2024-02-16 MED ORDER — OXYCODONE HCL 5 MG/5ML PO SOLN: 5.0000 mg | Freq: Once | ORAL | Status: AC | PRN

## 2024-02-16 MED ORDER — OXYCODONE HCL 5 MG PO TABS
5.0000 mg | ORAL_TABLET | Freq: Once | ORAL | Status: AC | PRN
  Administered 2024-02-16: 5 mg via ORAL

## 2024-02-16 MED ORDER — SODIUM CHLORIDE 0.9 % IR SOLN
Status: DC | PRN
  Administered 2024-02-16: 3000 mL

## 2024-02-16 MED ORDER — GLYCOPYRROLATE PF 0.2 MG/ML IJ SOSY
PREFILLED_SYRINGE | INTRAMUSCULAR | Status: AC
  Filled 2024-02-16: qty 1

## 2024-02-16 MED ORDER — DEXAMETHASONE SOD PHOSPHATE PF 10 MG/ML IJ SOLN
INTRAMUSCULAR | Status: AC
  Filled 2024-02-16: qty 1

## 2024-02-16 MED ORDER — ONDANSETRON HCL 4 MG/2ML IJ SOLN
INTRAMUSCULAR | Status: DC | PRN
  Administered 2024-02-16: 4 mg via INTRAVENOUS

## 2024-02-16 MED ORDER — ONDANSETRON HCL 4 MG/2ML IJ SOLN
INTRAMUSCULAR | Status: AC
  Filled 2024-02-16: qty 2

## 2024-02-16 MED ORDER — HYDROMORPHONE HCL 1 MG/ML IJ SOLN
INTRAMUSCULAR | Status: AC
  Filled 2024-02-16: qty 0.5

## 2024-02-16 MED ORDER — MIDAZOLAM HCL (PF) 2 MG/2ML IJ SOLN
INTRAMUSCULAR | Status: DC | PRN
  Administered 2024-02-16 (×2): 1 mg via INTRAVENOUS

## 2024-02-16 MED ORDER — LIDOCAINE HCL (CARDIAC) PF 100 MG/5ML IV SOSY: PREFILLED_SYRINGE | INTRAVENOUS | Status: DC | PRN

## 2024-02-16 MED ORDER — HYDROMORPHONE HCL 1 MG/ML IJ SOLN
INTRAMUSCULAR | Status: DC | PRN
  Administered 2024-02-16 (×2): .25 mg via INTRAVENOUS

## 2024-02-16 MED ORDER — EPHEDRINE SULFATE (PRESSORS) 25 MG/5ML IV SOSY
PREFILLED_SYRINGE | INTRAVENOUS | Status: DC | PRN
  Administered 2024-02-16: 10 mg via INTRAVENOUS

## 2024-02-16 MED ORDER — ARTIFICIAL TEARS OPHTHALMIC OINT
TOPICAL_OINTMENT | OPHTHALMIC | Status: AC
  Filled 2024-02-16: qty 3.5

## 2024-02-16 MED ORDER — ACETAMINOPHEN 500 MG PO TABS
1000.0000 mg | ORAL_TABLET | Freq: Once | ORAL | Status: AC
  Administered 2024-02-16: 1000 mg via ORAL

## 2024-02-16 MED ORDER — FENTANYL CITRATE (PF) 100 MCG/2ML IJ SOLN
INTRAMUSCULAR | Status: DC | PRN
  Administered 2024-02-16 (×4): 50 ug via INTRAVENOUS

## 2024-02-16 MED ORDER — ARTIFICIAL TEARS OPHTHALMIC OINT
TOPICAL_OINTMENT | OPHTHALMIC | Status: DC | PRN
  Administered 2024-02-16: 1 via OPHTHALMIC

## 2024-02-16 MED ORDER — ONDANSETRON 4 MG PO TBDP: 4.0000 mg | ORAL_TABLET | Freq: Three times a day (TID) | ORAL | 0 refills | Status: AC | PRN

## 2024-02-16 MED ORDER — ACETAMINOPHEN 500 MG PO TABS
ORAL_TABLET | ORAL | Status: AC
  Filled 2024-02-16: qty 2

## 2024-02-16 NOTE — Anesthesia Procedure Notes (Signed)
 Anesthesia Regional Block: Adductor canal block   Pre-Anesthetic Checklist: , timeout performed,  Correct Patient, Correct Site, Correct Laterality,  Correct Procedure, Correct Position, site marked,  Risks and benefits discussed,  Surgical consent,  Pre-op evaluation,  At surgeon's request and post-op pain management  Laterality: Right  Prep: chloraprep       Needles:  Injection technique: Single-shot  Needle Type: Echogenic Needle     Needle Length: 10cm  Needle Gauge: 21     Additional Needles:   Narrative:  Start time: 02/16/2024 8:03 AM End time: 02/16/2024 8:06 AM Injection made incrementally with aspirations every 5 mL.  Performed by: Personally  Anesthesiologist: Lucious Debby BRAVO, MD  Additional Notes: No pain on injection. No increased resistance to injection. Injection made in 5cc increments. Good needle visualization. Patient tolerated the procedure well.

## 2024-02-16 NOTE — Progress Notes (Signed)
 Assisted Dr. Mal Amabile with right, ultrasound guided block. Side rails up, monitors on throughout procedure. See vital signs in flow sheet. Tolerated Procedure well.

## 2024-02-16 NOTE — Anesthesia Procedure Notes (Signed)
 Procedure Name: LMA Insertion Date/Time: 02/16/2024 9:00 AM  Performed by: Edith Elsie Lloyd, CRNAPre-anesthesia Checklist: Patient identified, Emergency Drugs available, Suction available and Patient being monitored Patient Re-evaluated:Patient Re-evaluated prior to induction Oxygen Delivery Method: Circle system utilized Preoxygenation: Pre-oxygenation with 100% oxygen Induction Type: IV induction LMA: LMA inserted LMA Size: 4.0 Number of attempts: 1 Placement Confirmation: positive ETCO2 and breath sounds checked- equal and bilateral Tube secured with: Tape Dental Injury: Teeth and Oropharynx as per pre-operative assessment

## 2024-02-16 NOTE — Anesthesia Preprocedure Evaluation (Addendum)
"                                    Anesthesia Evaluation  Patient identified by MRN, date of birth, ID band Patient awake    Reviewed: Allergy & Precautions, NPO status , Patient's Chart, lab work & pertinent test results  History of Anesthesia Complications Negative for: history of anesthetic complications  Airway Mallampati: I  TM Distance: >3 FB Neck ROM: Full    Dental  (+) Dental Advisory Given, Teeth Intact   Pulmonary Current Smoker and Patient abstained from smoking.   Pulmonary exam normal        Cardiovascular negative cardio ROS Normal cardiovascular exam     Neuro/Psych  PSYCHIATRIC DISORDERS  Depression Bipolar Disorder    OCD negative neurological ROS     GI/Hepatic negative GI ROS,,,(+)     substance abuse  marijuana use  Endo/Other  negative endocrine ROS    Renal/GU negative Renal ROS     Musculoskeletal negative musculoskeletal ROS (+)    Abdominal   Peds  Hematology negative hematology ROS (+)   Anesthesia Other Findings   Reproductive/Obstetrics  s/p tubal ligation                               Anesthesia Physical Anesthesia Plan  ASA: 2  Anesthesia Plan: General   Post-op Pain Management: Tylenol  PO (pre-op)*, Regional block* and Toradol  IV (intra-op)*   Induction: Intravenous  PONV Risk Score and Plan: 2 and Treatment may vary due to age or medical condition, Ondansetron , Midazolam  and Diphenhydramine   Airway Management Planned: LMA  Additional Equipment: None  Intra-op Plan:   Post-operative Plan: Extubation in OR  Informed Consent: I have reviewed the patients History and Physical, chart, labs and discussed the procedure including the risks, benefits and alternatives for the proposed anesthesia with the patient or authorized representative who has indicated his/her understanding and acceptance.     Dental advisory given  Plan Discussed with: CRNA, Anesthesiologist and  Surgeon  Anesthesia Plan Comments:          Anesthesia Quick Evaluation  "

## 2024-02-16 NOTE — Discharge Instructions (Addendum)
 DISCHARGE INSTRUCTIONS: ________________________________________________________________________________ ACL RECONSTRUCTION HOME EXERCISE PROGRAM (0-2 WEEKS)   Elevate the leg above your heart as often as possible. Weight bear as tolerated with the Bledsoe brace, use crutches as needed, progress from 2 to 1 crutch as able using one crutch on opposite side of surgical knee.  Do not limp and do not walk too much!! You should sleep in the knee brace with it locked in full extension.  He may remove for showering.  Otherwise he may remove for exercise. Start normal showering on postoperative day #3.  Do not submerge underwater Goals for first two weeks:  minimal swelling, motion 0-90, walking with brace without crutches and positive attitude about PT. Exercise program to be performed as soon as as able 3-4 times per day followed by ice pack or ice bag for 20 minutes. Sitting over edge of bed or counter - Passive Range of Motion using opposite leg to bend and straighten knee as much as possible (5-10 minutes) Thigh tensing exercise - Push the knee into a towel roll and try to raise heel slightly off floor.  Hold for 8 seconds, rest 10 seconds. (15 times every hour) Straight leg raise lying on your back with opposite knee bent, keeping surgical knee as straight as possible.  You may do with knee immobilizer initially (3-5 sets of ten) Hamstring tensing - Dig heels into floor as if you were attempting to bend knee.  Do not allow knee to bend.  Hold for 8 seconds, rest 10 seconds Towel stretch - Towel around ball of foot stretching calf by pulling the ankle back while gradually leaning forward to stretch the back of the knee and thigh.  (Hold 20 seconds, 4 times each) Back of knee stretch - While lying on stomach, knee just over edge of bed (hold 2 minutes, 4 times) Use pain medication as needed.  You may also take Tylenol  and Advil  around-the-clock in alternating fashion in addition to the pain medication.   To prevent constipation use Colace 100mg . twice a day while on pain medication.  If constipated, use Miralax  17 gm once a day and drink plenty of fluids.  These medications can be obtained at the pharmacy without a prescription.   Follow up in the office in 14 days. You may remove your postoperative bandages on the third day from surgery and begin showering.  Do not remove the Steri-Strips.  Do not submerge underwater.  Replace your Ace bandage over your wounds before reapplying your knee brace You should also continue to wear the TED hose for 2 weeks postoperatively. You should also take an 81 mg aspirin once per day x 6 weeks for the prevention of DVT.     May take Tylenol  after 2:15 pm, if needed.    Post Anesthesia Home Care Instructions  Activity: Get plenty of rest for the remainder of the day. A responsible individual must stay with you for 24 hours following the procedure.  For the next 24 hours, DO NOT: -Drive a car -Advertising copywriter -Drink alcoholic beverages -Take any medication unless instructed by your physician -Make any legal decisions or sign important papers.  Meals: Start with liquid foods such as gelatin or soup. Progress to regular foods as tolerated. Avoid greasy, spicy, heavy foods. If nausea and/or vomiting occur, drink only clear liquids until the nausea and/or vomiting subsides. Call your physician if vomiting continues.  Special Instructions/Symptoms: Your throat may feel dry or sore from the anesthesia or the breathing tube placed in  your throat during surgery. If this causes discomfort, gargle with warm salt water. The discomfort should disappear within 24 hours.  If you had a scopolamine patch placed behind your ear for the management of post- operative nausea and/or vomiting:  1. The medication in the patch is effective for 72 hours, after which it should be removed.  Wrap patch in a tissue and discard in the trash. Wash hands thoroughly with soap and  water. 2. You may remove the patch earlier than 72 hours if you experience unpleasant side effects which may include dry mouth, dizziness or visual disturbances. 3. Avoid touching the patch. Wash your hands with soap and water after contact with the patch.   Regional Anesthesia Blocks  1. You may not be able to move or feel the blocked extremity after a regional anesthetic block. This may last may last from 3-48 hours after placement, but it will go away. The length of time depends on the medication injected and your individual response to the medication. As the nerves start to wake up, you may experience tingling as the movement and feeling returns to your extremity. If the numbness and inability to move your extremity has not gone away after 48 hours, please call your surgeon.   2. The extremity that is blocked will need to be protected until the numbness is gone and the strength has returned. Because you cannot feel it, you will need to take extra care to avoid injury. Because it may be weak, you may have difficulty moving it or using it. You may not know what position it is in without looking at it while the block is in effect.  3. For blocks in the legs and feet, returning to weight bearing and walking needs to be done carefully. You will need to wait until the numbness is entirely gone and the strength has returned. You should be able to move your leg and foot normally before you try and bear weight or walk. You will need someone to be with you when you first try to ensure you do not fall and possibly risk injury.  4. Bruising and tenderness at the needle site are common side effects and will resolve in a few days.  5. Persistent numbness or new problems with movement should be communicated to the surgeon or the [facility] [phone] [facility] [phone]).

## 2024-02-16 NOTE — Transfer of Care (Signed)
 Immediate Anesthesia Transfer of Care Note  Patient: MONQUIE FULGHAM  Procedure(s) Performed: KNEE ARTHROSCOPY WITH ANTERIOR CRUCIATE LIGAMENT (ACL) REPAIR (Right: Knee) ARTHROSCOPY, KNEE, WITH LATERAL MENISCECTOMY (Right: Knee)  Patient Location: PACU  Anesthesia Type:General and Regional  Level of Consciousness: awake, alert , and oriented  Airway & Oxygen Therapy: Patient Spontanous Breathing and Patient connected to face mask oxygen  Post-op Assessment: Report given to RN, Post -op Vital signs reviewed and stable, Patient moving all extremities X 4, and Patient able to stick tongue midline  Post vital signs: Reviewed and stable  Last Vitals:  Vitals Value Taken Time  BP 112/81 02/16/24 10:23  Temp 36.3 C 02/16/24 10:23  Pulse 75 02/16/24 10:28  Resp 15 02/16/24 10:28  SpO2 95 % 02/16/24 10:28  Vitals shown include unfiled device data.  Last Pain:  Vitals:   02/16/24 0814  TempSrc:   PainSc: 0-No pain      Patients Stated Pain Goal: 5 (02/16/24 0716)  Complications: No notable events documented.

## 2024-02-16 NOTE — H&P (Signed)
 "  ORTHOPAEDIC H and P  REQUESTING PHYSICIAN: Sharl Selinda Dover, MD  PCP:  Pcp, No  Chief Complaint: Right knee instability  HPI: Sonya Roach is a 39 y.o. female who complains of right knee pain and instability associated with a complete disruption of her ACL.  She has failed conservative treatments here today for arthroscopic assisted reconstruction.  No new complaints at this time.  Past Medical History:  Diagnosis Date   Bipolar 1 disorder (HCC)    Depression    OCD (obsessive compulsive disorder)    Past Surgical History:  Procedure Laterality Date   dilitation and curretage     TUBAL LIGATION     Social History   Socioeconomic History   Marital status: Single    Spouse name: Not on file   Number of children: Not on file   Years of education: Not on file   Highest education level: Not on file  Occupational History   Not on file  Tobacco Use   Smoking status: Every Day    Current packs/day: 1.00    Types: Cigarettes    Passive exposure: Current   Smokeless tobacco: Never   Tobacco comments:    States a pack last a week   Vaping Use   Vaping status: Never Used  Substance and Sexual Activity   Alcohol use: No    Alcohol/week: 0.0 standard drinks of alcohol   Drug use: Yes    Types: Marijuana    Comment: yesterday used @1430    Sexual activity: Yes    Partners: Male    Birth control/protection: Surgical  Other Topics Concern   Not on file  Social History Narrative   Not on file   Social Drivers of Health   Tobacco Use: High Risk (02/16/2024)   Patient History    Smoking Tobacco Use: Every Day    Smokeless Tobacco Use: Never    Passive Exposure: Current  Financial Resource Strain: Medium Risk (08/24/2022)   Received from [facility]   Overall Financial Resource Strain (CARDIA)    Difficulty of Paying Living Expenses: Somewhat hard  Food Insecurity: Food Insecurity Present (08/24/2022)   Received from [facility]   Epic    Within the  past 12 months, you worried that your food would run out before you got the money to buy more.: Sometimes true    Within the past 12 months, the food you bought just didn't last and you didn't have money to get more.: Sometimes true  Transportation Needs: No Transportation Needs (08/24/2022)   Received from [facility] - Transportation    Lack of Transportation (Medical): No    Lack of Transportation (Non-Medical): No  Physical Activity: Sufficiently Active (08/24/2022)   Received from [facility]    Exercise Vital Sign    On average, how many days per week do you engage in moderate to strenuous exercise (like a brisk walk)?: 7 days    On average, how many minutes do you engage in exercise at this level?: 60 min  Stress: Stress Concern Present (08/24/2022)   Received from [facility] of Occupational Health - Occupational Stress Questionnaire    Feeling of Stress : Rather much  Social Connections: Socially Isolated (08/24/2022)   Received from [facility]   Social Network    How would you rate your social network (family, work, friends)?: Little participation, lonely and socially isolated  Depression (PHQ2-9): Not on file  Alcohol Screen: Not on  file  Housing: Not on file  Utilities: Not At Risk (08/24/2022)   Received from [facility] Utilities    Threatened with loss of utilities: No  Health Literacy: Not on file   Family History  Problem Relation Age of Onset   Ovarian cancer Mother 72   Breast cancer Maternal Grandmother 32   Colon cancer Maternal Grandmother        dx 57s   Liver cancer Maternal Grandmother        dx 49s   Allergies[1] Prior to Admission medications  Medication Sig Start Date End Date Taking? Authorizing Provider  buPROPion  (WELLBUTRIN  XL) 150 MG 24 hr tablet Take 150 mg by mouth. 08/28/19   [provider]  Ferrous Sulfate (IRON PO) Take by mouth.    [provider]  Ferrous Sulfate (IRON PO)  Take by mouth. Patient not taking: Reported on 02/16/2024    [provider]   No results found.  Positive ROS: All other systems have been reviewed and were otherwise negative with the exception of those mentioned in the HPI and as above.  Physical Exam: General: Alert, no acute distress Cardiovascular: No pedal edema Respiratory: No cyanosis, no use of accessory musculature GI: No organomegaly, abdomen is soft and non-tender Skin: No lesions in the area of chief complaint Neurologic: Sensation intact distally Psychiatric: Patient is competent for consent with normal mood and affect Lymphatic: No axillary or cervical lymphadenopathy  MUSCULOSKELETAL: Right lower extremity is warm and well-perfused.  No open wounds or lesions.  She is neurovascular intact.  Assessment: 1.  Right knee anterior crucial ligament tear 2.  Right knee lateral meniscus tear  Plan: Plan to proceed today with arthroscopic assisted ACL reconstruction with hamstring allograft as well as partial lateral meniscectomy.  We again discussed the risk benefits of the procedure which include but not limited to bleeding, infection, damage to surrounding nerves and vessels, stiffness, failure of reconstruction graft, development of arthritis, fracture, DVT risk as well as risk of anesthesia.  She has provided informed consent.  Plan for discharge home today postoperatively from PACU.    Selinda Belvie Gosling, MD Cell [phone]    02/16/2024 7:32 AM     [1]  Allergies Allergen Reactions   Morphine  Itching   "

## 2024-02-16 NOTE — Anesthesia Postprocedure Evaluation (Signed)
"   Anesthesia Post Note  Patient: Sonya Roach  Procedure(s) Performed: KNEE ARTHROSCOPY WITH ANTERIOR CRUCIATE LIGAMENT (ACL) REPAIR (Right: Knee) ARTHROSCOPY, KNEE, WITH LATERAL MENISCECTOMY (Right: Knee)     Patient location during evaluation: PACU Anesthesia Type: General Level of consciousness: awake and alert Pain management: pain level controlled Vital Signs Assessment: post-procedure vital signs reviewed and stable Respiratory status: spontaneous breathing, nonlabored ventilation and respiratory function stable Cardiovascular status: stable and blood pressure returned to baseline Anesthetic complications: no   No notable events documented.  Last Vitals:  Vitals:   02/16/24 1115 02/16/24 1130  BP: 117/73 114/77  Pulse: (!) 58 70  Resp: 13 16  Temp:  36.7 C  SpO2: 98% 97%    Last Pain:  Vitals:   02/16/24 1145  TempSrc:   PainSc: 4                  Debby FORBES Like      "

## 2024-02-16 NOTE — Brief Op Note (Signed)
 OR Date: 02/16/2024 OR Patient Start Time:  8:56 AM  10:32 AM  PATIENT:  Sonya Roach  39 y.o. female  PRE-OPERATIVE DIAGNOSIS:  Rupture of anterior cruciate ligament of right knee, initial encounter  POST-OPERATIVE DIAGNOSIS:  Rupture of anterior cruciate ligament of right knee, initial encounter, right knee lateral meniscus tear  PROCEDURE:  Procedures with comments: KNEE ARTHROSCOPY WITH ANTERIOR CRUCIATE LIGAMENT (ACL) REPAIR (Right) - Right knee scope, ACL recon., PLM and FGL allograft ARTHROSCOPY, KNEE, WITH LATERAL MENISCECTOMY (Right)  SURGEON:  Surgeons and Role:    * Sharl Selinda Dover, MD - Primary  ASSISTANTS: Magdalene Fireman, RNFA   ANESTHESIA:   regional and general  EBL:  5 mL   BLOOD ADMINISTERED:none  DRAINS: none   LOCAL MEDICATIONS USED:  NONE  SPECIMEN:  No Specimen  DISPOSITION OF SPECIMEN:  N/A  COUNTS:  YES  TOURNIQUET:   Total Tourniquet Time Documented: Thigh (Right) - 57 minutes Total: Thigh (Right) - 57 minutes   DICTATION: .Note written in EPIC  PLAN OF CARE: Discharge to home after PACU  PATIENT DISPOSITION:  PACU - hemodynamically stable.   Delay start of Pharmacological VTE agent (>24hrs) due to surgical blood loss or risk of bleeding: not applicable

## 2024-02-16 NOTE — Op Note (Signed)
 "  Surgery Date: 02/16/2024    Surgeon(s): Sharl Selinda Dover, MD   ASSIST: Magdalene Fireman, RNFA   Implants:  Arthrex all inside cortical buttons on femur and tibia 4.75 PEEK swivel lock x 1. F GL hamstring allograft   ANESTHESIA:  general, and adductor block   IV FLUIDS AND URINE: See anesthesia.   TOURNIQUET: 50 minutes   DRAINS: none   COMPLICATIONS: None.     ESTIMATED BLOOD LOSS: minimal   PREOPERATIVE DIAGNOSES:  1.  Right knee lateral meniscus tear 2.  Right knee complete ACL rupture   POSTOPERATIVE DIAGNOSES:  same   PROCEDURES PERFORMED:  1.  Right knee arthroscopy with ACL reconstruction 2.  Right knee arthroscopic partial lateral meniscectomy    DESCRIPTION OF PROCEDURE:  Sonya Roach is a 39 year old female with right knee Lateral meniscus tear, Complete ACL rupture, partial MCL tear, possible medial meniscus tear.  They sustained these injuries about 2 months prior to coming to the operating room today.  After a short period of prehabilitation to allow for return of ROM and quadriceps strenght, we discussed proceeding with arthroscopically assisted hamstring allograft ACL reconstruction and lateral meniscectomy versus repair.  We reviewed the risks benefits and indications of this procedure including but not limited to bleeding, infection, damage to neurovascular structures, need for future surgery, developed an of arthrosis, rupture of graft, continued instability of the knee, and developement of blood clots and risk of anesthesia.  All questions answered.   The patient was identified in the preoperative holding area and the operative extremity was marked. The patient was brought to the operating room and transferred to operating table in a supine position. Satisfactory general anesthesia was induced by anesthesiology.     Examination under anesthesia revealed a grade 2B Lachman, grade 2 pivot shift, and stable to varus and valgus stress.       At the  back table, we prepared the allograft per the manufactures protocol for an all inside technique.. The graft was pretensioned on the back table and wrapped in a saline-soaked gauze.  The graft measured 70 mm in total length, 8 mm on femoral tunnel diameter, and 8.5 mm diameter on the tibial tunnel.   Standard anterolateral, anteromedial arthroscopy portals were obtained. The anteromedial portal was obtained with a spinal needle for localization under direct visualization with subsequent diagnostic findings.    Anteromedial and anterolateral chambers: mild synovitis. The synovitis was debrided with a 4.5 mm full radius shaver through both the anteromedial and lateral portals.    Suprapatellar pouch and gutters: no synovitis or debris. Patella chondral surface: Grade 0 Trochlear chondral surface: Grade 0 Patellofemoral tracking: Midline, no tilt Medial meniscus: Intact, no tear.  Medial femoral condyle flexion bearing surface: Grade 0 Medial femoral condyle extension bearing surface: Grade 0 Medial tibial plateau: Grade 0 Anterior cruciate ligament:Complete mid substance tear Posterior cruciate ligament:stable Lateral meniscus: Red-white zone parrot-beak tear mid body Lateral femoral condyle flexion bearing surface: Grade 0 Lateral femoral condyle extension bearing surface: Grade 0 Lateral tibial plateau: Grade 1    The lateral meniscus was inspected closely and found to have a red-white zone parrot-beak tear with instability and maceration of the free edge piece.  This was elected to be removed with meniscectomy.  We accomplish this with commendation of motorized shaver and meniscal biter.    Next, the ACL reconstruction was undertaken. The ACL stump was removed with thermal ablation and shaver and anatomic bony landmarks were marked for the placement of  the femoral and tibial sockets.  Arthrex retroguides and Flipcutters were used to create the sockets and perform the procedure by an  all-inside GraftLink technique.  The femoral socket was created at the inferior portion of the bifurcate ridge of the lateral femoral wall with a size 8.0 mm FlipCutter to a depth of  15 mm while the tibial socket was created at the center of the ACL footprint from front to back and toward the base of the medial tibial eminence from medial to lateral, to a depth of 23-25 mm with a 8.5 mm FlipCutter. Bony debris was removed and the edges of socket apertures were smoothed. Suture shuttles were used to deliver the graft into the femoral socket first and the tibial socket second. The graft was then secured within the sockets, cinching the self-locking sutures overtop of the proximal and distal cortical buttons with the knee in a reduced position maintained at 20 degrees flexion while a moderate force posterior drawer was applied.  After this preliminary tensioning, the knee was placed through several flexion-extension cycles to eliminate any graft settling or excursion and the graft was re-tensioned in the same manner and the sutures were tied over top of the buttons proximally and distally, and the four tibial sided suture arms were secondarily secured at the proximal tibia with 1 SwiveLock anchor. Of note we did also pass a free labral tape through the ACL fixation as an separate internal brace backup fixation.   Final images of the ACL graft were obtained, revealing no lateral wall or roof impingement of the graft at the notch through range of motion.  Stability of the ACL graft was assessed and found to be normalized at grade 0 lachman and grade 0 pivot shift.    The wounds were all closed in layers per usual.  Dressings were applied and a brace placed And locked in 0 of flexion..  There were no apparent complications.  The patient was awakened and taken to recovery room in satisfactory condition.   POSTOPERATIVE PLAN:  [name] will be touch down weight bearing on crutches until cleared by The  therapist.  They will likewise be in The knee brace with it locked until quad function is normalized. They will be on 81 mg asa daily for 6 weeks for DVT PPX.  they will return to the clinic to see the surgeon in 2 weeks.   Selinda Belvie Gosling   "
